# Patient Record
Sex: Female | Born: 1935 | Race: Black or African American
Health system: Southern US, Community
[De-identification: ages and names within clinical notes are randomized; demographics above are authoritative.]

---

## 2009-03-04 ENCOUNTER — Emergency Department: Payer: Self-pay | Admitting: Emergency Medicine

## 2010-01-23 ENCOUNTER — Inpatient Hospital Stay: Payer: Self-pay | Admitting: Internal Medicine

## 2010-02-16 ENCOUNTER — Inpatient Hospital Stay: Payer: Self-pay | Admitting: Internal Medicine

## 2010-03-28 ENCOUNTER — Ambulatory Visit: Payer: Self-pay | Admitting: Gastroenterology

## 2010-04-27 ENCOUNTER — Ambulatory Visit: Payer: Self-pay | Admitting: Ophthalmology

## 2010-04-27 DIAGNOSIS — I119 Hypertensive heart disease without heart failure: Secondary | ICD-10-CM

## 2010-05-09 ENCOUNTER — Ambulatory Visit: Payer: Self-pay | Admitting: Ophthalmology

## 2010-06-22 ENCOUNTER — Ambulatory Visit: Payer: Self-pay | Admitting: Ophthalmology

## 2010-06-27 ENCOUNTER — Ambulatory Visit: Payer: Self-pay | Admitting: Ophthalmology

## 2010-10-07 ENCOUNTER — Ambulatory Visit: Payer: Self-pay | Admitting: Family Medicine

## 2010-11-23 ENCOUNTER — Emergency Department: Payer: Self-pay | Admitting: Emergency Medicine

## 2011-01-05 ENCOUNTER — Ambulatory Visit: Payer: Self-pay | Admitting: Family Medicine

## 2011-01-23 ENCOUNTER — Ambulatory Visit: Payer: Self-pay | Admitting: Family Medicine

## 2011-05-26 ENCOUNTER — Inpatient Hospital Stay: Payer: Self-pay | Admitting: Student

## 2011-05-26 LAB — COMPREHENSIVE METABOLIC PANEL
Calcium, Total: 7.9 mg/dL — ABNORMAL LOW (ref 8.5–10.1)
Co2: 33 mmol/L — ABNORMAL HIGH (ref 21–32)
EGFR (Non-African Amer.): 25 — ABNORMAL LOW
Glucose: 106 mg/dL — ABNORMAL HIGH (ref 65–99)
Osmolality: 289 (ref 275–301)
Potassium: 5.6 mmol/L — ABNORMAL HIGH (ref 3.5–5.1)
Total Protein: 8.1 g/dL (ref 6.4–8.2)

## 2011-05-26 LAB — CBC
HGB: 10.9 g/dL — ABNORMAL LOW (ref 12.0–16.0)
MCH: 32.6 pg (ref 26.0–34.0)
MCV: 104 fL — ABNORMAL HIGH (ref 80–100)
Platelet: 207 10*3/uL (ref 150–440)
RBC: 3.34 10*6/uL — ABNORMAL LOW (ref 3.80–5.20)
RDW: 13.3 % (ref 11.5–14.5)

## 2011-05-26 LAB — URINALYSIS, COMPLETE
Bilirubin,UR: NEGATIVE
Glucose,UR: NEGATIVE mg/dL
Granular Cast: 19
Hyaline Cast: 9
Nitrite: NEGATIVE
Ph: 5
Protein: 30
RBC,UR: 215 /HPF
Specific Gravity: 1.011
Squamous Epithelial: 3
WBC UR: 4 /HPF

## 2011-05-26 LAB — FOLATE: Folic Acid: 28.8 ng/mL (ref 3.1–100.0)

## 2011-05-26 LAB — ACETAMINOPHEN LEVEL: Acetaminophen: <2 ug/mL

## 2011-05-26 LAB — PROTIME-INR
INR: 1.3
Prothrombin Time: 16.4 s — ABNORMAL HIGH

## 2011-05-26 LAB — TROPONIN I: Troponin-I: 0.22 ng/mL — ABNORMAL HIGH

## 2011-05-26 LAB — CK TOTAL AND CKMB (NOT AT ARMC)
CK, Total: 154 U/L
CK-MB: 1 ng/mL

## 2011-05-26 LAB — PRO B NATRIURETIC PEPTIDE: B-Type Natriuretic Peptide: 6429 pg/mL — ABNORMAL HIGH

## 2011-05-26 LAB — VALPROIC ACID LEVEL: Valproic Acid: 36 ug/mL — ABNORMAL LOW

## 2011-05-27 LAB — COMPREHENSIVE METABOLIC PANEL
Albumin: 2.7 g/dL — ABNORMAL LOW (ref 3.4–5.0)
Alkaline Phosphatase: 161 U/L — ABNORMAL HIGH (ref 50–136)
BUN: 46 mg/dL — ABNORMAL HIGH (ref 7–18)
Co2: 34 mmol/L — ABNORMAL HIGH (ref 21–32)
Creatinine: 1.95 mg/dL — ABNORMAL HIGH (ref 0.60–1.30)
EGFR (African American): 26 — ABNORMAL LOW
Potassium: 4.8 mmol/L (ref 3.5–5.1)
SGOT(AST): 799 U/L — ABNORMAL HIGH (ref 15–37)
SGPT (ALT): 367 U/L — ABNORMAL HIGH
Sodium: 143 mmol/L (ref 136–145)
Total Protein: 6.8 g/dL (ref 6.4–8.2)

## 2011-05-27 LAB — CBC WITH DIFFERENTIAL/PLATELET
Basophil #: 0 10*3/uL (ref 0.0–0.1)
Eosinophil #: 0 10*3/uL (ref 0.0–0.7)
HCT: 29.5 % — ABNORMAL LOW (ref 35.0–47.0)
Lymphocyte #: 0.8 10*3/uL — ABNORMAL LOW (ref 1.0–3.6)
Lymphocyte %: 16.5 %
MCV: 104 fL — ABNORMAL HIGH (ref 80–100)
Monocyte #: 0.5 x10 3/mm (ref 0.2–0.9)
Neutrophil #: 3.6 10*3/uL (ref 1.4–6.5)
Platelet: 172 10*3/uL (ref 150–440)
RBC: 2.84 10*6/uL — ABNORMAL LOW (ref 3.80–5.20)
RDW: 13.5 % (ref 11.5–14.5)
WBC: 5 10*3/uL (ref 3.6–11.0)

## 2011-05-27 LAB — CK TOTAL AND CKMB (NOT AT ARMC)
CK, Total: 138 U/L (ref 21–215)
CK, Total: 160 U/L (ref 21–215)
CK-MB: 1.4 ng/mL (ref 0.5–3.6)
CK-MB: 1.8 ng/mL (ref 0.5–3.6)

## 2011-05-27 LAB — HEMOGLOBIN A1C: Hemoglobin A1C: 5.4 % (ref 4.2–6.3)

## 2011-05-27 LAB — TROPONIN I: Troponin-I: 0.27 ng/mL — ABNORMAL HIGH

## 2011-05-28 LAB — HEPATIC FUNCTION PANEL A (ARMC)
Alkaline Phosphatase: 144 U/L — ABNORMAL HIGH (ref 50–136)
Bilirubin, Direct: <0.1 mg/dL (ref 0.00–0.20)
Bilirubin,Total: 0.2 mg/dL (ref 0.2–1.0)
SGPT (ALT): 287 U/L — ABNORMAL HIGH

## 2011-05-28 LAB — BASIC METABOLIC PANEL
BUN: 41 mg/dL — ABNORMAL HIGH (ref 7–18)
Calcium, Total: 7.2 mg/dL — ABNORMAL LOW (ref 8.5–10.1)
Chloride: 101 mmol/L (ref 98–107)
Co2: 34 mmol/L — ABNORMAL HIGH (ref 21–32)
EGFR (Non-African Amer.): 32 — ABNORMAL LOW
Glucose: 191 mg/dL — ABNORMAL HIGH (ref 65–99)
Osmolality: 298 (ref 275–301)

## 2011-05-29 LAB — CBC WITH DIFFERENTIAL/PLATELET
Basophil #: 0 10*3/uL (ref 0.0–0.1)
Basophil %: 0.1 %
HGB: 10.1 g/dL — ABNORMAL LOW (ref 12.0–16.0)
Lymphocyte #: 0.4 10*3/uL — ABNORMAL LOW (ref 1.0–3.6)
MCH: 32.3 pg (ref 26.0–34.0)
MCHC: 31.3 g/dL — ABNORMAL LOW (ref 32.0–36.0)
MCV: 103 fL — ABNORMAL HIGH (ref 80–100)
Monocyte #: 0.3 x10 3/mm (ref 0.2–0.9)
Monocyte %: 5.5 %
Neutrophil %: 86.7 %
Platelet: 206 10*3/uL (ref 150–440)
RDW: 13.4 % (ref 11.5–14.5)
WBC: 5.4 10*3/uL (ref 3.6–11.0)

## 2011-05-29 LAB — BASIC METABOLIC PANEL
Anion Gap: 6 — ABNORMAL LOW (ref 7–16)
Calcium, Total: 7.4 mg/dL — ABNORMAL LOW (ref 8.5–10.1)
Chloride: 100 mmol/L (ref 98–107)
Co2: 34 mmol/L — ABNORMAL HIGH (ref 21–32)
EGFR (Non-African Amer.): 37 — ABNORMAL LOW
Glucose: 199 mg/dL — ABNORMAL HIGH (ref 65–99)
Osmolality: 293 (ref 275–301)

## 2011-05-29 LAB — EXPECTORATED SPUTUM ASSESSMENT W GRAM STAIN, RFLX TO RESP C

## 2011-05-30 LAB — BASIC METABOLIC PANEL
Anion Gap: 5 — ABNORMAL LOW (ref 7–16)
BUN: 30 mg/dL — ABNORMAL HIGH (ref 7–18)
Chloride: 104 mmol/L (ref 98–107)
Co2: 34 mmol/L — ABNORMAL HIGH (ref 21–32)
Glucose: 168 mg/dL — ABNORMAL HIGH (ref 65–99)
Osmolality: 295 (ref 275–301)
Potassium: 4 mmol/L (ref 3.5–5.1)

## 2011-10-13 ENCOUNTER — Ambulatory Visit: Payer: Self-pay | Admitting: Geriatric Medicine

## 2011-10-13 LAB — URINALYSIS, COMPLETE
Bilirubin,UR: NEGATIVE
Glucose,UR: NEGATIVE mg/dL (ref 0–75)
Hyaline Cast: 4
Ketone: NEGATIVE
Nitrite: NEGATIVE
Protein: NEGATIVE
Specific Gravity: 1.016 (ref 1.003–1.030)
Squamous Epithelial: NONE SEEN
WBC UR: <1 /HPF (ref 0–5)

## 2011-10-15 ENCOUNTER — Ambulatory Visit: Payer: Self-pay | Admitting: Internal Medicine

## 2011-10-17 ENCOUNTER — Other Ambulatory Visit: Payer: Self-pay | Admitting: Geriatric Medicine

## 2011-10-17 LAB — URINALYSIS, COMPLETE
Bilirubin,UR: NEGATIVE
Blood: NEGATIVE
Glucose,UR: NEGATIVE mg/dL (ref 0–75)
Hyaline Cast: 18
Ketone: NEGATIVE
Leukocyte Esterase: NEGATIVE
Nitrite: NEGATIVE
Ph: 5 (ref 4.5–8.0)
RBC,UR: <1 /HPF (ref 0–5)
Squamous Epithelial: NONE SEEN
WBC UR: <1 /HPF (ref 0–5)

## 2011-10-18 ENCOUNTER — Inpatient Hospital Stay: Payer: Self-pay | Admitting: Internal Medicine

## 2011-10-18 ENCOUNTER — Ambulatory Visit: Payer: Self-pay | Admitting: Geriatric Medicine

## 2011-10-18 LAB — CBC WITH DIFFERENTIAL/PLATELET
Basophil #: 0 10*3/uL (ref 0.0–0.1)
Basophil %: 0.4 %
Comment - H1-Com1: NORMAL
HCT: 26 % — ABNORMAL LOW (ref 35.0–47.0)
HCT: 26.9 % — ABNORMAL LOW (ref 35.0–47.0)
HGB: 8 g/dL — ABNORMAL LOW (ref 12.0–16.0)
HGB: 8.5 g/dL — ABNORMAL LOW (ref 12.0–16.0)
Lymphocyte #: 0.8 10*3/uL — ABNORMAL LOW (ref 1.0–3.6)
Lymphocytes: 7 %
MCHC: 30.9 g/dL — ABNORMAL LOW (ref 32.0–36.0)
MCHC: 31.8 g/dL — ABNORMAL LOW (ref 32.0–36.0)
MCV: 98 fL (ref 80–100)
MCV: 98 fL (ref 80–100)
Monocytes: 11 %
NRBC/100 WBC: 2 /
Neutrophil #: 5.7 10*3/uL (ref 1.4–6.5)
Platelet: 235 10*3/uL (ref 150–440)
Platelet: 237 10*3/uL (ref 150–440)
RDW: 14.8 % — ABNORMAL HIGH (ref 11.5–14.5)
RDW: 14.7 % — ABNORMAL HIGH (ref 11.5–14.5)
WBC: 9.3 10*3/uL (ref 3.6–11.0)

## 2011-10-18 LAB — BASIC METABOLIC PANEL
Anion Gap: 5 — ABNORMAL LOW (ref 7–16)
Anion Gap: 4 — ABNORMAL LOW (ref 7–16)
BUN: 67 mg/dL — ABNORMAL HIGH (ref 7–18)
BUN: 70 mg/dL — ABNORMAL HIGH (ref 7–18)
Calcium, Total: 7.6 mg/dL — ABNORMAL LOW (ref 8.5–10.1)
Calcium, Total: 7.5 mg/dL — ABNORMAL LOW (ref 8.5–10.1)
Chloride: 108 mmol/L — ABNORMAL HIGH (ref 98–107)
Co2: 28 mmol/L (ref 21–32)
Creatinine: 2.91 mg/dL — ABNORMAL HIGH (ref 0.60–1.30)
EGFR (African American): 17 — ABNORMAL LOW
EGFR (African American): 16 — ABNORMAL LOW
EGFR (Non-African Amer.): 14 — ABNORMAL LOW
Glucose: 123 mg/dL — ABNORMAL HIGH (ref 65–99)
Osmolality: 293 (ref 275–301)
Osmolality: 300 (ref 275–301)
Potassium: 7 mmol/L (ref 3.5–5.1)
Potassium: 5.6 mmol/L — ABNORMAL HIGH (ref 3.5–5.1)
Sodium: 136 mmol/L (ref 136–145)

## 2011-10-18 LAB — COMPREHENSIVE METABOLIC PANEL
Alkaline Phosphatase: 200 U/L — ABNORMAL HIGH (ref 50–136)
Calcium, Total: 7.4 mg/dL — ABNORMAL LOW (ref 8.5–10.1)
Chloride: 105 mmol/L (ref 98–107)
Co2: 24 mmol/L (ref 21–32)
EGFR (African American): 18 — ABNORMAL LOW
EGFR (Non-African Amer.): 15 — ABNORMAL LOW
Osmolality: 295 (ref 275–301)
SGOT(AST): 65 U/L — ABNORMAL HIGH (ref 15–37)
SGPT (ALT): 13 U/L (ref 12–78)
Sodium: 137 mmol/L (ref 136–145)
Total Protein: 7 g/dL (ref 6.4–8.2)

## 2011-10-18 LAB — TROPONIN I: Troponin-I: <0.02 ng/mL

## 2011-10-19 DIAGNOSIS — I369 Nonrheumatic tricuspid valve disorder, unspecified: Secondary | ICD-10-CM

## 2011-10-19 DIAGNOSIS — I498 Other specified cardiac arrhythmias: Secondary | ICD-10-CM

## 2011-10-19 LAB — TROPONIN I
Troponin-I: 0.02 ng/mL
Troponin-I: 0.03 ng/mL

## 2011-10-19 LAB — BASIC METABOLIC PANEL
BUN: 68 mg/dL — ABNORMAL HIGH (ref 7–18)
Chloride: 109 mmol/L — ABNORMAL HIGH (ref 98–107)
Co2: 24 mmol/L (ref 21–32)
Creatinine: 2.77 mg/dL — ABNORMAL HIGH (ref 0.60–1.30)
EGFR (African American): 17 — ABNORMAL LOW
Osmolality: 302 (ref 275–301)
Sodium: 141 mmol/L (ref 136–145)

## 2011-10-19 LAB — CBC WITH DIFFERENTIAL/PLATELET
Basophil %: 0.3 %
Eosinophil #: 0 10*3/uL (ref 0.0–0.7)
Eosinophil %: 0.2 %
HCT: 24 % — ABNORMAL LOW (ref 35.0–47.0)
HGB: 7.4 g/dL — ABNORMAL LOW (ref 12.0–16.0)
Lymphocyte #: 0.7 10*3/uL — ABNORMAL LOW (ref 1.0–3.6)
MCH: 30 pg (ref 26.0–34.0)
MCV: 98 fL (ref 80–100)
Monocyte #: 0.5 x10 3/mm (ref 0.2–0.9)
Monocyte %: 6 %
Neutrophil #: 7.5 10*3/uL — ABNORMAL HIGH (ref 1.4–6.5)
Platelet: 197 10*3/uL (ref 150–440)
RBC: 2.46 10*6/uL — ABNORMAL LOW (ref 3.80–5.20)
WBC: 8.7 10*3/uL (ref 3.6–11.0)

## 2011-10-19 LAB — IRON AND TIBC
Iron Bind.Cap.(Total): 421 ug/dL (ref 250–450)
Iron Saturation: 5 %
Iron: 19 ug/dL — ABNORMAL LOW (ref 50–170)

## 2011-10-19 LAB — CK TOTAL AND CKMB (NOT AT ARMC)
CK, Total: 132 U/L (ref 21–215)
CK, Total: 134 U/L (ref 21–215)
CK-MB: 2.3 ng/mL (ref 0.5–3.6)
CK-MB: 2.8 ng/mL (ref 0.5–3.6)

## 2011-10-19 LAB — URINE CULTURE

## 2011-10-19 LAB — MAGNESIUM: Magnesium: 3.2 mg/dL — ABNORMAL HIGH

## 2011-10-20 LAB — URINALYSIS, COMPLETE
Bilirubin,UR: NEGATIVE
Glucose,UR: NEGATIVE mg/dL (ref 0–75)
Ketone: NEGATIVE
Leukocyte Esterase: NEGATIVE
Nitrite: NEGATIVE
Specific Gravity: 1.011 (ref 1.003–1.030)
Squamous Epithelial: <1
WBC UR: 4 /HPF (ref 0–5)

## 2011-10-20 LAB — CBC WITH DIFFERENTIAL/PLATELET
Comment - H1-Com3: NORMAL
HCT: 24.4 % — ABNORMAL LOW (ref 35.0–47.0)
MCH: 29.2 pg (ref 26.0–34.0)
MCV: 97 fL (ref 80–100)
Monocytes: 5 %
NRBC/100 WBC: 4 /
Platelet: 172 10*3/uL (ref 150–440)
RBC: 2.51 10*6/uL — ABNORMAL LOW (ref 3.80–5.20)
RDW: 14.8 % — ABNORMAL HIGH (ref 11.5–14.5)
Segmented Neutrophils: 88 %

## 2011-10-20 LAB — BASIC METABOLIC PANEL
Anion Gap: 7 (ref 7–16)
BUN: 69 mg/dL — ABNORMAL HIGH (ref 7–18)
Calcium, Total: 6.9 mg/dL — CL (ref 8.5–10.1)
EGFR (African American): 20 — ABNORMAL LOW
EGFR (Non-African Amer.): 17 — ABNORMAL LOW
Glucose: 190 mg/dL — ABNORMAL HIGH (ref 65–99)
Osmolality: 303 (ref 275–301)

## 2011-10-20 LAB — PROTEIN / CREATININE RATIO, URINE
Creatinine, Urine: 83.9 mg/dL (ref 30.0–125.0)
Protein, Random Urine: 82 mg/dL — ABNORMAL HIGH (ref 0–12)
Protein/Creat. Ratio: 977 mg/gCREAT — ABNORMAL HIGH (ref 0–200)

## 2011-10-21 LAB — BASIC METABOLIC PANEL
Anion Gap: 4 — ABNORMAL LOW (ref 7–16)
Calcium, Total: 6.9 mg/dL — CL (ref 8.5–10.1)
Chloride: 110 mmol/L — ABNORMAL HIGH (ref 98–107)
Co2: 26 mmol/L (ref 21–32)
Creatinine: 2.01 mg/dL — ABNORMAL HIGH (ref 0.60–1.30)
EGFR (African American): 25 — ABNORMAL LOW
EGFR (Non-African Amer.): 22 — ABNORMAL LOW
Glucose: 126 mg/dL — ABNORMAL HIGH (ref 65–99)
Osmolality: 297 (ref 275–301)

## 2011-10-21 LAB — HEMOGLOBIN: HGB: 7.2 g/dL — ABNORMAL LOW (ref 12.0–16.0)

## 2011-10-22 LAB — COMPREHENSIVE METABOLIC PANEL
Albumin: 2.3 g/dL — ABNORMAL LOW (ref 3.4–5.0)
Anion Gap: 7 (ref 7–16)
BUN: 45 mg/dL — ABNORMAL HIGH (ref 7–18)
Calcium, Total: 7.2 mg/dL — ABNORMAL LOW (ref 8.5–10.1)
Chloride: 112 mmol/L — ABNORMAL HIGH (ref 98–107)
Co2: 27 mmol/L (ref 21–32)
EGFR (African American): 36 — ABNORMAL LOW
EGFR (Non-African Amer.): 31 — ABNORMAL LOW
Glucose: 96 mg/dL (ref 65–99)
Osmolality: 302 (ref 275–301)
Potassium: 4.6 mmol/L (ref 3.5–5.1)
SGOT(AST): 197 U/L — ABNORMAL HIGH (ref 15–37)
SGPT (ALT): 261 U/L — ABNORMAL HIGH (ref 12–78)
Sodium: 146 mmol/L — ABNORMAL HIGH (ref 136–145)
Total Protein: 5.8 g/dL — ABNORMAL LOW (ref 6.4–8.2)

## 2011-10-22 LAB — CBC WITH DIFFERENTIAL/PLATELET
Lymphocyte %: 18.6 %
Monocyte #: 0.8 x10 3/mm (ref 0.2–0.9)
Monocyte %: 13.6 %
Platelet: 151 10*3/uL (ref 150–440)
RBC: 2.2 10*6/uL — ABNORMAL LOW (ref 3.80–5.20)
RDW: 14.9 % — ABNORMAL HIGH (ref 11.5–14.5)
WBC: 5.9 10*3/uL (ref 3.6–11.0)

## 2011-10-22 LAB — PROTEIN ELECTROPHORESIS(ARMC)

## 2011-10-22 LAB — OCCULT BLOOD X 1 CARD TO LAB, STOOL: Occult Blood, Feces: POSITIVE

## 2011-10-23 LAB — CBC WITH DIFFERENTIAL/PLATELET
Eosinophil #: 0.1 10*3/uL (ref 0.0–0.7)
Eosinophil %: 0.6 %
Lymphocyte %: 14.8 %
MCH: 30.1 pg (ref 26.0–34.0)
MCHC: 31.8 g/dL — ABNORMAL LOW (ref 32.0–36.0)
MCV: 95 fL (ref 80–100)
Monocyte #: 1.2 x10 3/mm — ABNORMAL HIGH (ref 0.2–0.9)
Platelet: 153 10*3/uL (ref 150–440)
RBC: 2.49 10*6/uL — ABNORMAL LOW (ref 3.80–5.20)
RDW: 14.9 % — ABNORMAL HIGH (ref 11.5–14.5)
WBC: 8.9 10*3/uL (ref 3.6–11.0)

## 2011-10-23 LAB — BASIC METABOLIC PANEL
Anion Gap: 5 — ABNORMAL LOW (ref 7–16)
BUN: 36 mg/dL — ABNORMAL HIGH (ref 7–18)
Calcium, Total: 7.8 mg/dL — ABNORMAL LOW (ref 8.5–10.1)
Chloride: 113 mmol/L — ABNORMAL HIGH (ref 98–107)
Co2: 27 mmol/L (ref 21–32)
Creatinine: 1.58 mg/dL — ABNORMAL HIGH (ref 0.60–1.30)
Osmolality: 299 (ref 275–301)
Potassium: 4.6 mmol/L (ref 3.5–5.1)

## 2011-10-23 LAB — VANCOMYCIN, TROUGH: Vancomycin, Trough: 14 ug/mL (ref 10–20)

## 2011-10-23 LAB — UR PROT ELECTROPHORESIS, URINE RANDOM

## 2011-10-24 LAB — CULTURE, BLOOD (SINGLE)

## 2011-10-29 ENCOUNTER — Inpatient Hospital Stay: Payer: Self-pay | Admitting: Internal Medicine

## 2011-10-29 LAB — URINALYSIS, COMPLETE
Bilirubin,UR: NEGATIVE
Glucose,UR: NEGATIVE mg/dL (ref 0–75)
Hyaline Cast: 53
Leukocyte Esterase: NEGATIVE
Nitrite: NEGATIVE
Protein: 30
RBC,UR: 1 /HPF (ref 0–5)
Specific Gravity: 1.017 (ref 1.003–1.030)
Squamous Epithelial: 2
WBC UR: 2 /HPF (ref 0–5)

## 2011-10-29 LAB — CK TOTAL AND CKMB (NOT AT ARMC)
CK, Total: 98 U/L (ref 21–215)
CK-MB: 0.8 ng/mL (ref 0.5–3.6)

## 2011-10-29 LAB — COMPREHENSIVE METABOLIC PANEL
Albumin: 2.7 g/dL — ABNORMAL LOW (ref 3.4–5.0)
Alkaline Phosphatase: 144 U/L — ABNORMAL HIGH (ref 50–136)
Anion Gap: 5 — ABNORMAL LOW (ref 7–16)
BUN: 30 mg/dL — ABNORMAL HIGH (ref 7–18)
Bilirubin,Total: 0.5 mg/dL (ref 0.2–1.0)
Calcium, Total: 7.6 mg/dL — ABNORMAL LOW (ref 8.5–10.1)
Chloride: 108 mmol/L — ABNORMAL HIGH (ref 98–107)
Creatinine: 1.63 mg/dL — ABNORMAL HIGH (ref 0.60–1.30)
EGFR (African American): 32 — ABNORMAL LOW
Glucose: 124 mg/dL — ABNORMAL HIGH (ref 65–99)
Potassium: 6.5 mmol/L (ref 3.5–5.1)
SGPT (ALT): 113 U/L — ABNORMAL HIGH (ref 12–78)
Sodium: 140 mmol/L (ref 136–145)
Total Protein: 7.2 g/dL (ref 6.4–8.2)

## 2011-10-29 LAB — CBC
HGB: 8.3 g/dL — ABNORMAL LOW (ref 12.0–16.0)
MCH: 29.8 pg (ref 26.0–34.0)
MCV: 97 fL (ref 80–100)
Platelet: 169 10*3/uL (ref 150–440)
RBC: 2.78 10*6/uL — ABNORMAL LOW (ref 3.80–5.20)

## 2011-10-29 LAB — TROPONIN I: Troponin-I: 0.07 ng/mL — ABNORMAL HIGH

## 2011-10-29 LAB — MAGNESIUM: Magnesium: 3 mg/dL — ABNORMAL HIGH

## 2011-10-29 LAB — POTASSIUM: Potassium: 6.1 mmol/L — ABNORMAL HIGH (ref 3.5–5.1)

## 2011-10-29 LAB — PHOSPHORUS: Phosphorus: 3.4 mg/dL (ref 2.5–4.9)

## 2011-10-29 LAB — PRO B NATRIURETIC PEPTIDE: B-Type Natriuretic Peptide: 3888 pg/mL — ABNORMAL HIGH (ref 0–450)

## 2011-10-30 LAB — COMPREHENSIVE METABOLIC PANEL
Albumin: 2.5 g/dL — ABNORMAL LOW (ref 3.4–5.0)
Alkaline Phosphatase: 135 U/L (ref 50–136)
Anion Gap: 5 — ABNORMAL LOW (ref 7–16)
Bilirubin,Total: 0.3 mg/dL (ref 0.2–1.0)
Calcium, Total: 7.7 mg/dL — ABNORMAL LOW (ref 8.5–10.1)
Co2: 30 mmol/L (ref 21–32)
Creatinine: 1.58 mg/dL — ABNORMAL HIGH (ref 0.60–1.30)
EGFR (African American): 34 — ABNORMAL LOW
Osmolality: 291 (ref 275–301)
Potassium: 5.3 mmol/L — ABNORMAL HIGH (ref 3.5–5.1)
SGOT(AST): 88 U/L — ABNORMAL HIGH (ref 15–37)
SGPT (ALT): 93 U/L — ABNORMAL HIGH (ref 12–78)

## 2011-10-30 LAB — CBC WITH DIFFERENTIAL/PLATELET
Basophil %: 0.8 %
Eosinophil #: 0.1 10*3/uL (ref 0.0–0.7)
HCT: 24.3 % — ABNORMAL LOW (ref 35.0–47.0)
Lymphocyte #: 1 10*3/uL (ref 1.0–3.6)
Lymphocyte %: 15.4 %
MCHC: 31.2 g/dL — ABNORMAL LOW (ref 32.0–36.0)
MCV: 97 fL (ref 80–100)
Neutrophil #: 4.4 10*3/uL (ref 1.4–6.5)
Platelet: 143 10*3/uL — ABNORMAL LOW (ref 150–440)
RBC: 2.5 10*6/uL — ABNORMAL LOW (ref 3.80–5.20)
RDW: 15.2 % — ABNORMAL HIGH (ref 11.5–14.5)

## 2011-10-30 LAB — BASIC METABOLIC PANEL
Anion Gap: 5 — ABNORMAL LOW (ref 7–16)
BUN: 29 mg/dL — ABNORMAL HIGH (ref 7–18)
Chloride: 108 mmol/L — ABNORMAL HIGH (ref 98–107)
EGFR (Non-African Amer.): 32 — ABNORMAL LOW
Potassium: 5.6 mmol/L — ABNORMAL HIGH (ref 3.5–5.1)

## 2011-10-30 LAB — TROPONIN I: Troponin-I: 0.14 ng/mL — ABNORMAL HIGH

## 2011-10-31 LAB — BASIC METABOLIC PANEL
BUN: 27 mg/dL — ABNORMAL HIGH (ref 7–18)
Calcium, Total: 7.6 mg/dL — ABNORMAL LOW (ref 8.5–10.1)
Chloride: 106 mmol/L (ref 98–107)
Co2: 32 mmol/L (ref 21–32)
EGFR (African American): 42 — ABNORMAL LOW
Osmolality: 293 (ref 275–301)
Potassium: 4.6 mmol/L (ref 3.5–5.1)

## 2011-10-31 LAB — CBC WITH DIFFERENTIAL/PLATELET
Basophil %: 0.8 %
Eosinophil #: 0.1 10*3/uL (ref 0.0–0.7)
Eosinophil %: 2.3 %
HCT: 23.4 % — ABNORMAL LOW (ref 35.0–47.0)
HGB: 7.2 g/dL — ABNORMAL LOW (ref 12.0–16.0)
Lymphocyte %: 17.2 %
MCHC: 30.6 g/dL — ABNORMAL LOW (ref 32.0–36.0)
Monocyte #: 0.8 x10 3/mm (ref 0.2–0.9)
Monocyte %: 11.9 %
Neutrophil #: 4.3 10*3/uL (ref 1.4–6.5)
Neutrophil %: 67.8 %
RBC: 2.46 10*6/uL — ABNORMAL LOW (ref 3.80–5.20)
WBC: 6.4 10*3/uL (ref 3.6–11.0)

## 2011-11-01 LAB — BASIC METABOLIC PANEL
Anion Gap: 6 — ABNORMAL LOW (ref 7–16)
BUN: 24 mg/dL — ABNORMAL HIGH (ref 7–18)
Calcium, Total: 8 mg/dL — ABNORMAL LOW (ref 8.5–10.1)
Chloride: 101 mmol/L (ref 98–107)
Co2: 34 mmol/L — ABNORMAL HIGH (ref 21–32)
EGFR (African American): 46 — ABNORMAL LOW
Osmolality: 292 (ref 275–301)
Potassium: 4.5 mmol/L (ref 3.5–5.1)

## 2011-11-01 LAB — CBC WITH DIFFERENTIAL/PLATELET
Basophil #: 0 10*3/uL (ref 0.0–0.1)
Eosinophil #: 0 10*3/uL (ref 0.0–0.7)
Eosinophil %: 0 %
HCT: 26.8 % — ABNORMAL LOW (ref 35.0–47.0)
MCH: 29.9 pg (ref 26.0–34.0)
MCHC: 32.2 g/dL (ref 32.0–36.0)
Monocyte #: 0.3 x10 3/mm (ref 0.2–0.9)
Neutrophil %: 82.6 %
Platelet: 194 10*3/uL (ref 150–440)
RBC: 2.89 10*6/uL — ABNORMAL LOW (ref 3.80–5.20)
RDW: 14.9 % — ABNORMAL HIGH (ref 11.5–14.5)
WBC: 4.5 10*3/uL (ref 3.6–11.0)

## 2011-11-03 LAB — BASIC METABOLIC PANEL
Anion Gap: 7 (ref 7–16)
BUN: 26 mg/dL — ABNORMAL HIGH (ref 7–18)
Chloride: 100 mmol/L (ref 98–107)
Creatinine: 1.14 mg/dL (ref 0.60–1.30)
EGFR (African American): 50 — ABNORMAL LOW
EGFR (Non-African Amer.): 43 — ABNORMAL LOW
Glucose: 152 mg/dL — ABNORMAL HIGH (ref 65–99)
Osmolality: 293 (ref 275–301)
Potassium: 4.2 mmol/L (ref 3.5–5.1)

## 2011-11-04 LAB — CULTURE, BLOOD (SINGLE)

## 2011-11-14 ENCOUNTER — Ambulatory Visit: Payer: Self-pay | Admitting: Internal Medicine

## 2011-12-10 IMAGING — CR DG CHEST 2V
1 series · 3 of 3 positions shown · non-contrast
Comparison: none

REASON FOR EXAM: chest pain
COMMENTS:   May transport without cardiac monitor

[Series 1: view not recorded · 0.17mm/px · 3 of 3 slices shown]
[im 1/3]
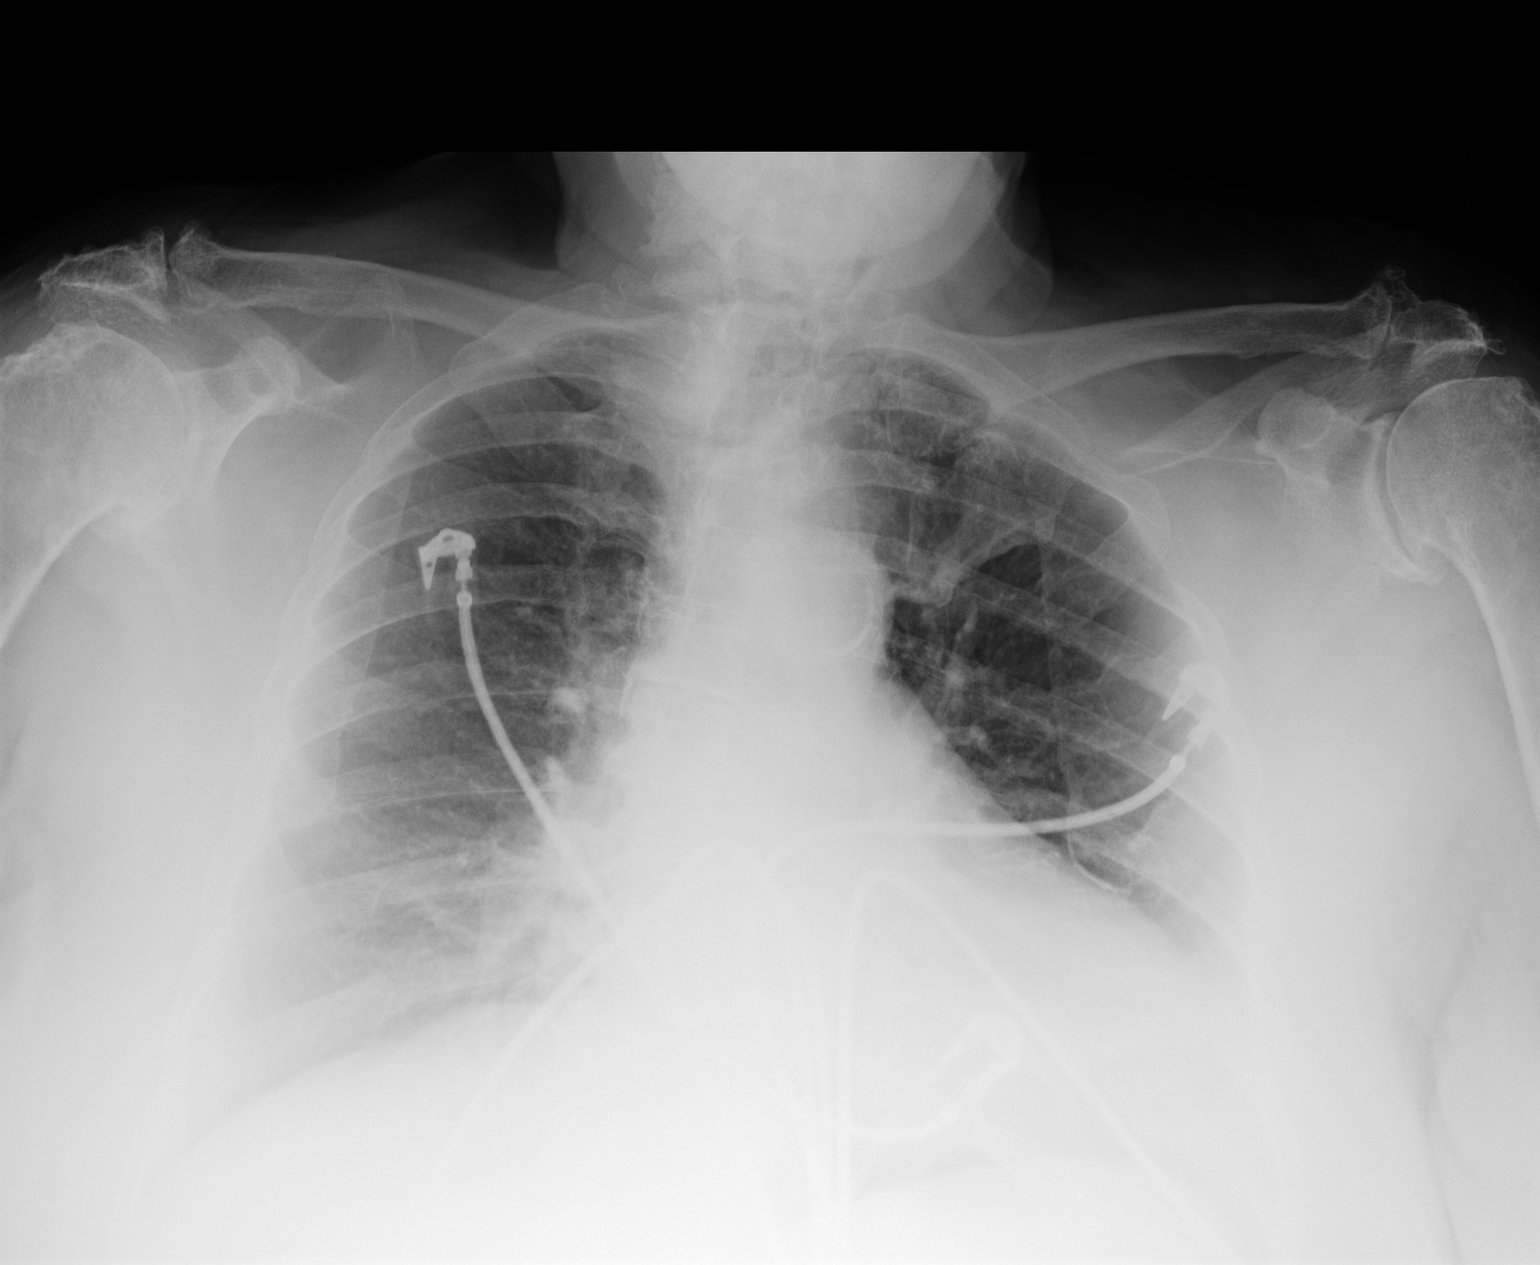
[im 2/3]
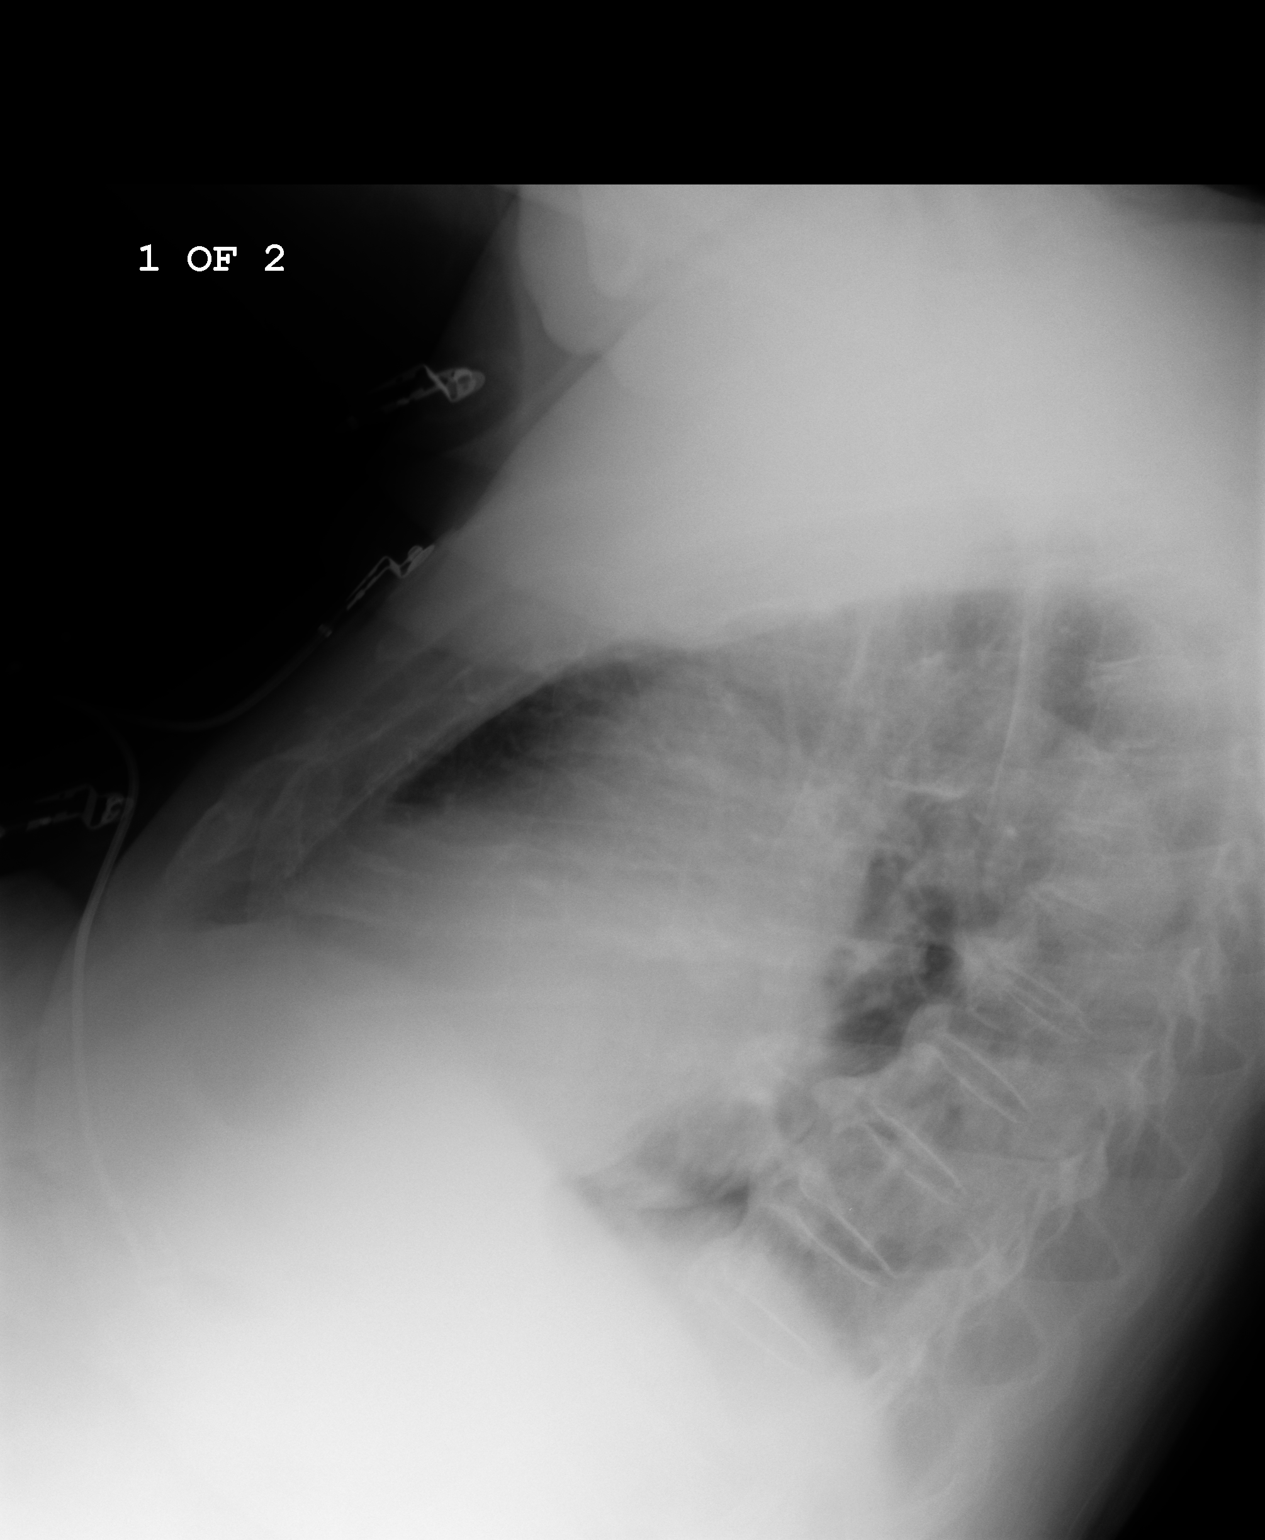
[im 3/3]
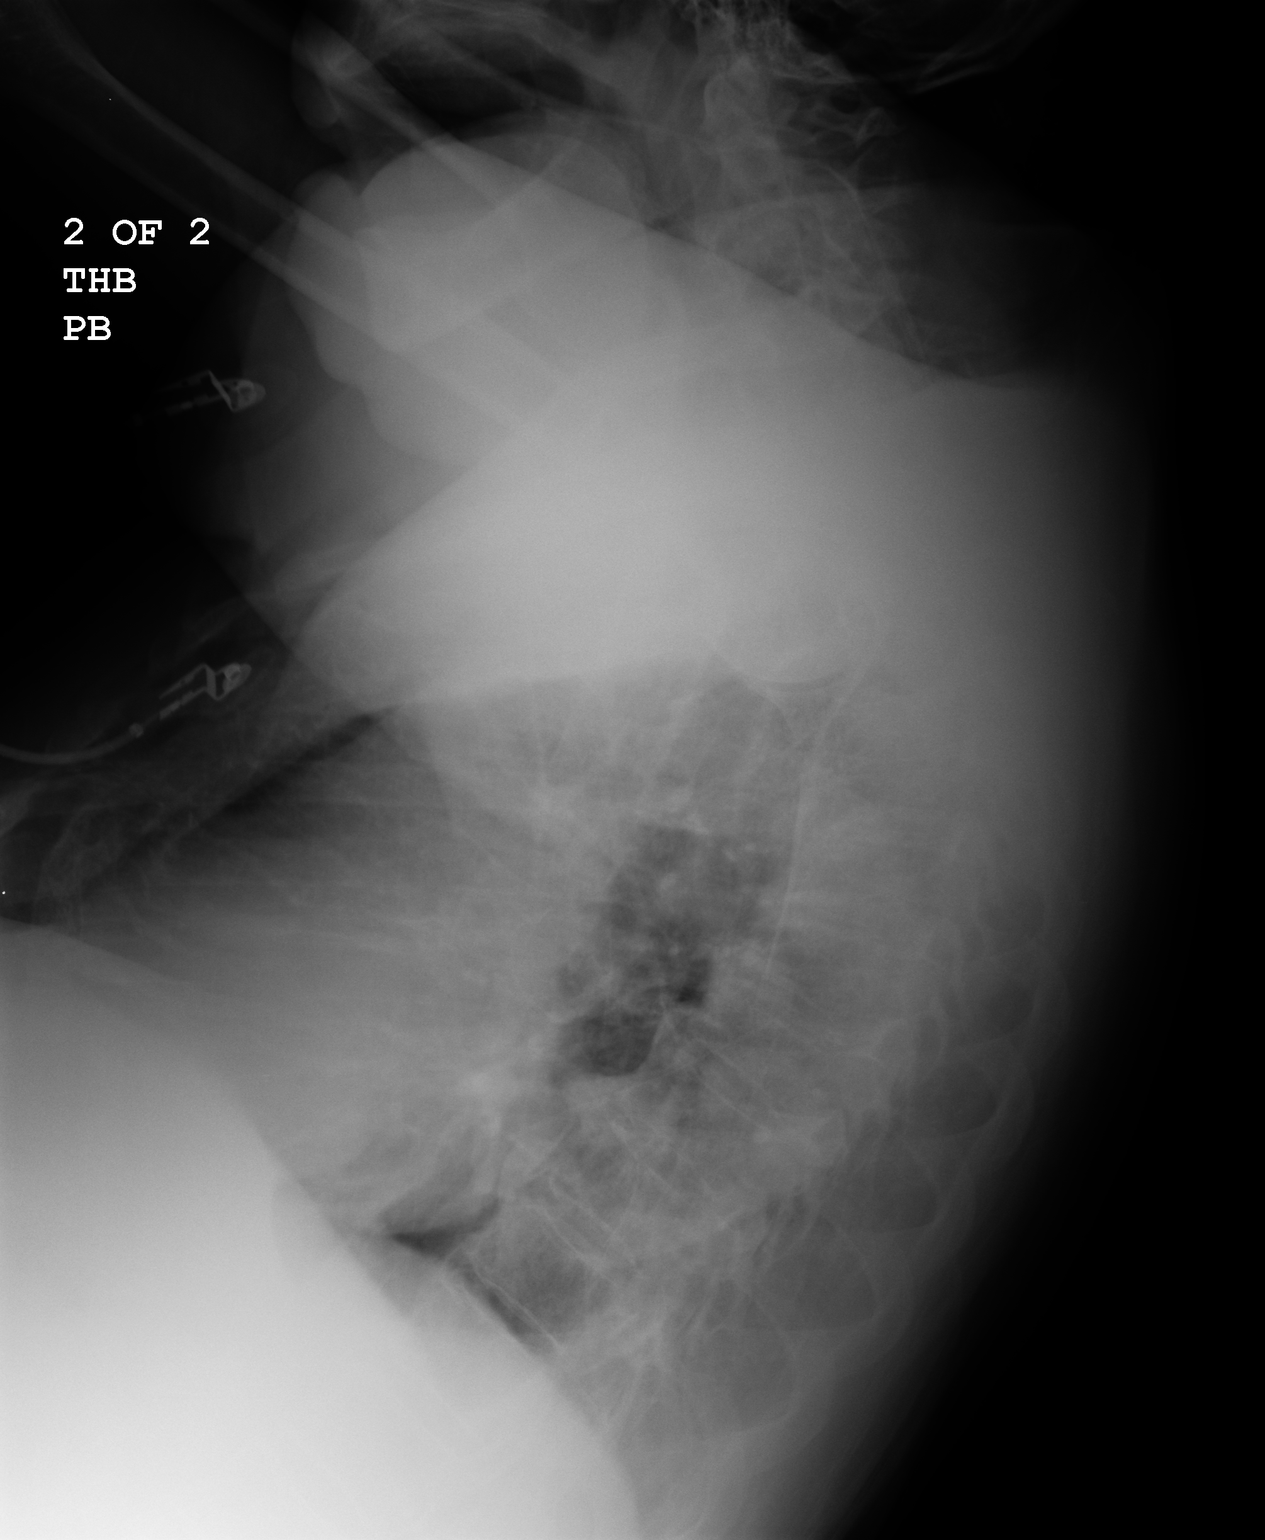

[3 of 3 positions shown; findings below may reference images not displayed]

PROCEDURE:     DXR - DXR CHEST PA (OR AP) AND LATERAL  - March 04, 2009 [DATE]

RESULT:     There is no previous exam for comparison. The projection is
lordotic. Monitoring electrodes are present. Degenerative spurring is
present within the spine. There does not appear to be a significant pleural
effusion. There is no edema or definite pneumonia.
IMPRESSION: Follow-up erect PA and lateral views of the chest would be
recommended. There is no definite evidence of congestive failure or
infiltrate. The heart size appears to be somewhat prominent but given the
lordotic projection this may be artifactual. Atherosclerotic changes are
noted in the aortic arch.

## 2012-11-24 IMAGING — NM NUCLEAR MEDICINE GASTROINTESTINAL BLEEDING STUDY
1 series · 6 of 6 positions shown · non-contrast
Comparison: none

REASON FOR EXAM: rectal bleeding
COMMENTS:

PROCEDURE:     NM  - NM GI BLOOD LOSS STUDY  - February 17, 2010  [DATE]
RESULT:     Comparison: None.
Radiopharmaceutical: 22.32 mCi 5c-VVm administered intravenously. 3 mL PYP
TECHNIQUE: Dynamic, anterior planar images of the abdomen were obtained over
60 minutes.

[Series 1000: gi bleed · 4.80mm/px · 6 of 250 frames shown]
[frame 21/250]
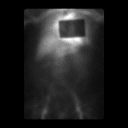
[frame 63/250]
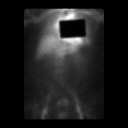
[frame 105/250]
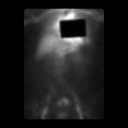
[frame 146/250]
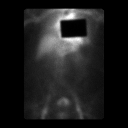
[frame 188/250]
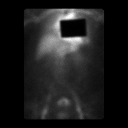
[frame 230/250]
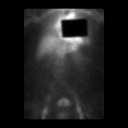

[6 of 6 positions shown; findings below may reference images not displayed]

FINDINGS: There is normal radiotracer activity in the liver and vascular system. There
is mild, progressive activity in the lower pelvis which is felt to represent
excreted radiotracer activity in the bladder. This does not peristalse. No
abnormal radiotracer activity identified in the remainder of the abdomen or
pelvis.
IMPRESSION: No scintigraphic evidence of gastrointestinal bleed. Radiotracer activity in
the pelvis is felt to be within the bladder.

## 2013-01-01 ENCOUNTER — Other Ambulatory Visit: Payer: Self-pay | Admitting: Family Medicine

## 2013-01-01 LAB — CBC WITH DIFFERENTIAL/PLATELET
Basophil %: 0.3 %
HCT: 26 % — ABNORMAL LOW (ref 35.0–47.0)
HGB: 7.5 g/dL — ABNORMAL LOW (ref 12.0–16.0)
Lymphocyte #: 1 10*3/uL (ref 1.0–3.6)
Lymphocyte %: 5.3 %
MCV: 80 fL (ref 80–100)
Monocyte #: 1.5 x10 3/mm — ABNORMAL HIGH (ref 0.2–0.9)
Monocyte %: 7.8 %
Neutrophil #: 16.6 10*3/uL — ABNORMAL HIGH (ref 1.4–6.5)
Neutrophil %: 86.6 %
Platelet: 163 10*3/uL (ref 150–440)
WBC: 19.2 10*3/uL — ABNORMAL HIGH (ref 3.6–11.0)

## 2013-01-01 LAB — URINALYSIS, COMPLETE
Bilirubin,UR: NEGATIVE
Protein: 100
Specific Gravity: 1.017 (ref 1.003–1.030)

## 2013-01-01 LAB — BASIC METABOLIC PANEL
Anion Gap: 4 — ABNORMAL LOW (ref 7–16)
Co2: 33 mmol/L — ABNORMAL HIGH (ref 21–32)
Creatinine: 2.93 mg/dL — ABNORMAL HIGH (ref 0.60–1.30)
EGFR (African American): 16 — ABNORMAL LOW
EGFR (Non-African Amer.): 14 — ABNORMAL LOW
Sodium: 134 mmol/L — ABNORMAL LOW (ref 136–145)

## 2013-01-03 LAB — URINE CULTURE

## 2013-01-17 ENCOUNTER — Other Ambulatory Visit: Payer: Self-pay | Admitting: Family Medicine

## 2013-01-17 LAB — BASIC METABOLIC PANEL
Anion Gap: 5 — ABNORMAL LOW (ref 7–16)
Calcium, Total: 7.7 mg/dL — ABNORMAL LOW (ref 8.5–10.1)
Chloride: 99 mmol/L (ref 98–107)
Co2: 36 mmol/L — ABNORMAL HIGH (ref 21–32)
EGFR (African American): 30 — ABNORMAL LOW
EGFR (Non-African Amer.): 26 — ABNORMAL LOW
Potassium: 4.1 mmol/L (ref 3.5–5.1)
Sodium: 140 mmol/L (ref 136–145)

## 2013-01-17 LAB — CBC WITH DIFFERENTIAL/PLATELET
Basophil %: 0.7 %
Eosinophil #: 0.1 10*3/uL (ref 0.0–0.7)
Eosinophil %: 1.8 %
HCT: 26.5 % — ABNORMAL LOW (ref 35.0–47.0)
Lymphocyte %: 24 %
MCH: 23.6 pg — ABNORMAL LOW (ref 26.0–34.0)
MCHC: 29.2 g/dL — ABNORMAL LOW (ref 32.0–36.0)
Monocyte #: 0.9 x10 3/mm (ref 0.2–0.9)
Monocyte %: 14.4 %
Neutrophil %: 59.1 %
Platelet: 203 10*3/uL (ref 150–440)
RBC: 3.28 10*6/uL — ABNORMAL LOW (ref 3.80–5.20)
RDW: 21.2 % — ABNORMAL HIGH (ref 11.5–14.5)

## 2013-02-13 DEATH — deceased

## 2014-06-02 NOTE — Consult Note (Signed)
PATIENT NAME:  Jacqueline Browning, Jacqueline Browning MR#:  [id number] DATE OF BIRTH:  [birth year]  DATE OF CONSULTATION:  10/19/2011  REFERRING PHYSICIAN:  Dr. Darvin Neighbours  CONSULTING PHYSICIAN:  Kordelia Severin Lilian Kapur, MD  REASON FOR CONSULTATION: Acute renal failure, hyperkalemia.  HISTORY OF PRESENT ILLNESS: The patient is an 81 year old African American female with past medical history of diastolic congestive heart failure, hypertension, diabetes mellitus, dementia, coronary artery disease, gastroesophageal reflux disease, diverticulosis, chronic anemia, history of dysphagia who presented to [facility] with shortness of breath and abnormal blood work. She resides at [facility]. She previously lived at [facility] but the patient's granddaughter states that she was moved to [facility] subsequently. Patient unfortunately is a poor historian and unable to provide any significant history regarding her present illness. The patient's granddaughter is currently at the bedside. It appears that she has had quite poor p.o. intake over the past day or two per the patient's granddaughter's report. She is significantly hypotensive now and was started on dopamine. She is also receiving IV fluids at a rate of 250 mL/h. She had a 2-D echocardiogram performed today which showed a normal ejection fraction, however, there was a pattern of pseudonormalization which suggests underlying diastolic heart failure. She is coughing significantly. She has secretions which are thick and brown in color. Patient had a chest x-ray performed. The patient has been receiving Lasix. Her urine is extremely concentrated at this time. She has also receive some rectal Kayexalate. Potassium is currently down to 5.9. The patient's baseline creatinine appears to be 1.14 from 11/2010. She also has a low hemoglobin of 7.4. Patient had a urinalysis performed on 09/03 which showed urine protein 30 mg/dL, only 1 RBC was noted per high-power field.    PAST MEDICAL HISTORY:  1. Diastolic heart failure.  2. Hypertension.  3. Diabetes mellitus.  4. Dementia.  5. Coronary artery disease.  6. Gastroesophageal reflux disease.  7. Diverticulosis.  8. Chronic anemia.  9. History of dysphagia.   ALLERGIES: Celebrex and Zantac.   CURRENT INPATIENT MEDICATIONS:  1. Tylenol 650 mg p.o. every four hours p.r.n.  2. Advair Diskus 250/50, 1 puff inhaled b.i.d.  3. Lasix 40 mg IV every 12 hours. 4. Heparin 5000 units sub-Q q.12 hours.   5. Sliding scale insulin. 6. Singulair 10 mg p.o. daily.  7. Zofran 4 mg IV every four hours p.r.n.  8. Protonix 40 mg p.o. q.6 a.m.   SOCIAL HISTORY: Patient resides at [facility]. She quit smoking approximately one year ago. No reported alcohol or illicit drug use. Patient has a health care power of attorney who is [employer].   FAMILY HISTORY: Currently unobtainable from the patient.    REVIEW OF SYSTEMS: Unobtainable from the patient as she is unable to provide reliable review of systems.   PHYSICAL EXAMINATION:  VITAL SIGNS: Temperature 95.7, pulse 46, respirations 19, blood pressure 127/16, pulse oximetry 96% on 3 liters.   GENERAL: Morbidly obese African American female who appears her reported age, currently in no acute distress.   HEENT: Normocephalic, atraumatic. Extraocular movements are intact. Pupils equal, round, reactive to light. No scleral icterus. Conjunctivae are pale. No epistaxis noted. Gross hearing is difficult to assess at this time. Oral mucosa are quite dry.   NECK: Supple without JVD or lymphadenopathy.   LUNGS: Lungs demonstrate coarse rhonchi bilaterally with normal respiratory effort.   CARDIOVASCULAR: S1, S2. Patient is noted to be bradycardic. No murmurs or rubs.  ABDOMEN: Obese, soft, nontender, nondistended. Bowel sounds positive. No rebound or guarding. No gross organomegaly appreciated.   EXTREMITIES: No clubbing, cyanosis noted. 2+ pitting edema noted in  both lower extremities.   NEUROLOGICAL: Patient is awake. She will follow a few simple commands but not consistently.   GENITOURINARY: Foley catheter noted to be in place.   MUSCULOSKELETAL: No joint redness, swelling or tenderness appreciated.   SKIN: Warm and dry. No rashes noted.   PSYCHIATRIC: Unable to assess at this time.   LABORATORY, DIAGNOSTIC AND RADIOLOGICAL DATA: Sodium 141, potassium 5.9, chloride 109, CO2 24, BUN 68, creatinine 2.7, glucose 109, iron 19, iron saturation 5. Troponin 0.02. CBC shows WBC 8.7, hemoglobin 7.4, hematocrit 24, platelets 197. Blood cultures thus far are negative. Urine culture from 09/03 is thus far negative. Urinalysis performed on 10/17/2011 shows urine protein 30 mg/dL, less than 1 RBC per high-power field as well as less than 1 WBC per high-power field.   IMPRESSION AND RECOMMENDATIONS: This is an 81 year old African American female with past medical history of diastolic heart failure, known sinus bradycardia, hypertension, diabetes mellitus, dementia, coronary artery disease, gastroesophageal reflux disease, diverticulosis, chronic anemia with iron deficiency now, mild dysphagia who presented to [facility] with shortness of breath, acute renal failure.  1. Acute renal failure. I suspect most likely that the patient has underlying acute tubular necrosis. She is hypotensive now which may be contributing to acute tubular necrosis. We will discontinue Lasix at this point in time and giving the patient 250 mL of normal saline as a bolus. Thereafter we will decrease the rate of normal saline to 80 mL/h. We will proceed with further work-up including renal ultrasound, SPEP, UPEP, ANA, ANCA antibodies, GBM antibodies, C3, and C4. No acute indication for dialysis at this time, however, if potassium continues to be an issue we may need to consider renal placement therapy. The patient's health care power of attorney, Brunetta Jeans, appears to be  willing to perform this if needed. However, we did explain that it is not indicated at this moment; however, we will continue to follow the patient's case quite closely.  2. Hyperkalemia. Serum potassium was quite high at presentation at 6.6. It peaked at 7. It is now 5.9. We will continue to provide rectal Kayexalate for now. However, as above, if this continues to be an issue we may need to consider renal replacement therapy.  3. Hypotension. Unclear what is causing her hypotension at present. Patient's ejection fraction was found to be normal on 2-D echocardiogram. Patient has a significant cough with productive sputum which could potentially represent pneumonia and could be a potential source for sepsis. I will discuss this a bit further with the hospitalist and would consider antibiotic therapy.  4. Iron deficiency anemia. Patient noted to be quite anemic with a hemoglobin of 7.4 and low iron saturation of 5%. However, given concerns for infection would not administer parenteral iron at this time. Would consider blood transfusion for hemoglobin of 7 or less or if acute coronary syndrome were to be present.   I would like to thank Dr. Darvin Neighbours for this kind referral.  ____________________________ Tama High, MD mnl:cms D: 10/19/2011 12:53:38 ET T: 10/19/2011 13:09:16 ET JOB#: [id number]  cc: Tama High, MD, <Dictator> Mariah Milling Ngan Qualls MD ELECTRONICALLY SIGNED 11/21/2011 23:24

## 2014-06-02 NOTE — Consult Note (Signed)
CC: anemia, heme positive stool.  Hgb came from 6.6 to 7.5 after one unit.  No obvious bleeding.  No GI exams planned.  Continue bid PPI.  Electronic Signatures: Scot JunElliott, Robert T (MD)  (Signed on 09-Sep-13 18:50)  Authored  Last Updated: 09-Sep-13 18:50 by Scot JunElliott, Robert T (MD)

## 2014-06-02 NOTE — Consult Note (Signed)
PATIENT NAME:  Jacqueline Browning, Jacqueline Browning MR#:  [id number] DATE OF BIRTH:  Sep 21, 1921  DATE OF CONSULTATION:  10/19/2011  REFERRING PHYSICIAN:  Milagros Loll, MD CONSULTING PHYSICIAN:  Muhammad A. Kirke Corin, MD PRIMARY CARE PHYSICIAN: Dorothey Baseman, MD  REASON FOR CONSULTATION: Bradycardia.   HISTORY OF PRESENT ILLNESS: This is an 81 year old African American female who is a resident of a skilled nursing facility. She was brought to the emergency room after she was noted to have abnormal labs with renal failure. She has history of diastolic heart failure, hypertension, coronary artery disease with unknown details and history of asthma. She had an echocardiogram done in April which showed normal LV systolic function, moderate mitral regurgitation, and moderate pulmonary hypertension. The patient is not able to provide any history at this time. I am not sure if this is her baseline. The patient was noted to have acute renal failure with a creatinine of 2.1. Most recent renal function was within normal limits a few months ago. Her potassium was noted to be 7. She was bradycardic in the 30s. She received atropine, calcium, insulin, dextrose, and bicarbonate. Her heart rate is currently in the mid 40s. Blood pressure is stable.   PAST MEDICAL HISTORY:  1. Diastolic heart failure.  2. Known history of sinus bradycardia. Her baseline heart rate runs in the 50s.  3. Hypertension.  4. Type 2 diabetes.  5. Dementia.  6. Reported chronic coronary artery disease.  7. GERD.  8. Diverticulosis.  9. Chronic anemia.  10. Dysphagia.   ALLERGIES: Celebrex and Zantac.   HOME MEDICATIONS:  1. Acetaminophen/oxycodone.  2. Advair Diskus.  3. Albuterol.  4. Atrovent.  5. Alprazolam.  6. Amlodipine 10 mg daily.  7. Aricept 10 mg daily.  8. Aspirin 81 mg once daily.  9. Fentanyl patch 25 mcg every 3 days.  10. Neurontin 300 mg 2 times daily.  11. Eyedrops.  12. Glipizide.  13. Lisinopril 5 mg daily.  14. Singulair  10 mg daily.  15. Omeprazole 20 mg daily.  16. MiraLAX.  17. Sertraline 100 mg daily.  18. Vitamin D3.   FAMILY HISTORY: Unknown and unable to obtain from the patient.   SOCIAL HISTORY: The patient is a resident of a skilled nursing facility. She is a previous smoker.   REVIEW OF SYSTEMS: Unable to obtain at this time from the patient.   PHYSICAL EXAMINATION:   GENERAL: The patient is an elderly female who is currently in no acute distress.   VITAL SIGNS: Pulse is 44 beats per minute, respiratory rate is 21, blood pressure is 131/70, oxygen saturation is 98% on 3L nasal cannula.   HEENT: Normocephalic, atraumatic.   NECK: No jugular venous distention or carotid bruits.   RESPIRATORY: Normal respiratory effort with diminished breath sounds at the base.   CARDIOVASCULAR: Normal PMI. Normal S1 and S2 but bradycardic. No gallops or murmurs.   ABDOMEN: Benign, nontender, nondistended.   EXTREMITIES: +1 edema bilaterally.   SKIN: Warm and dry with no rash.   PSYCHIATRIC: Patient is not communicating at this time.   LABORATORY AND DIAGNOSTIC DATA: Initial creatinine was 2.65 which worsened slightly but then improved to 2.77. BUN is 68. Potassium on presentation was 7 and improved to 5.9. She is anemic with a hemoglobin of 7.4. Thyroid function was within normal limits. Cardiac enzymes were normal. ECG showed junctional bradycardia. Currently she is having intermittent sinus bradycardia with junctional bradycardia.   IMPRESSION:  1. Bradycardia, likely due to electrolyte abnormalities in the  setting of acute renal failure.  2. Chronic diastolic heart failure.  3. Acute renal failure.  4. Dementia.   RECOMMENDATIONS: The patient's bradycardia appears to be improving with treatment. Her baseline heart rate tends to run in the 50s anyway. I agree with current management including correcting the underlying electrolyte abnormalities, especially hyperkalemia. Her potassium is still a  little bit high at 5.9. Continue hydration to improve renal function. There is no indication for a permanent pacemaker at this time. Can use atropine as needed and dopamine as well if blood pressure becomes low. However, this does not appear to be needed at this time. If her hyperkalemia worsens, calcium gluconate can be given.   ____________________________ Chelsea AusMuhammad A. Kirke CorinArida, MD maa:vtd D: 10/19/2011 09:12:12 ET T: 10/19/2011 10:51:59 ET JOB#: [id number] cc: Muhammad A. Kirke CorinArida, MD, <Dictator> Srikar R. Sudini, MD Teena Iraniavid M. Terance HartBronstein, MD Jerolyn CenterMUHAMMAD Argentina DonovanA ARIDA MD ELECTRONICALLY SIGNED 11/17/2011 13:15

## 2014-06-02 NOTE — Discharge Summary (Signed)
PATIENT NAME:  Jacqueline Browning, Jacqueline Browning MR#:  [id number] DATE OF BIRTH:  1921/05/02  DATE OF ADMISSION:  10/18/2011 DATE OF DISCHARGE:  10/24/2011   DISCHARGE DIAGNOSES:  1. Severe hyperkalemia, now resolved.  2. Acute renal failure likely due to acute tubular necrosis with hypotension on admission, now improving.  3. Acute on chronic diastolic heart failure with normal echocardiogram with normal LV function well compensated at this time. 4. Altered mental status, likely metabolic encephalopathy from underlying renal failure and hyperkalemia along with acute illnesses, now close to baseline. She also has likely underlying dementia. She had negative blood culture. 5. Possible pneumonia, on ora Augmentin, improving.  6. Hypocalcemia, replaced and resolved. 7. Bradycardia/hypotension on admission, required dopamine, in ICU for a few days. Now she is off dopamine and blood pressure and heart rate are stable.  8. Anemia of chronic disease with iron deficiency requiring 1 unit of packed red blood cell transfusion. No gross bleeding. Hemoglobin is stable.  9. Cough likely due to pneumonia, on Robitussin and antibiotics.   SECONDARY DIAGNOSES:  1. History of congestive heart failure.  2. Bradycardia.  3. Hypertension.  4. Diabetes.  5. Dementia.  6. Coronary artery disease. 7. CKD. 8. Gastroesophageal reflux disease. 9. Diverticulosis with rectal bleeding. 10. Chronic anemia.  11. Mild dysphagia.   CONSULTATIONS:  1. Cardiology, Dr. Kirke Corin   2. Nephrology, Dr. Cherylann Ratel   3. Speech therapy  4. GI, Dr. Mechele Collin   5. Palliative Care   PROCEDURES/RADIOLOGY:  1. Chest x-ray on September 4th showed findings consistent with CHF and pulmonary interstitial edema.  2. Chest x-ray on September 5th showed CHF. Possible pleural effusion and likely underlying atelectasis.  3. Bilateral renal ultrasound on September 5th showed mild medical renal disease within the right kidney, otherwise unremarkable.  4. 2-D  echocardiogram on September 5th showed EF of more than 55%. Pseudonormalization pattern. Mild concentric LVH. Mildly dilated left atrium. Mild to moderate tricuspid regurgitation. Elevated RV systolic pressure at 40 to 50 mmHg.   MAJOR LABORATORY PANEL: Blood cultures x2 were negative on admission. Serum cortisol level was high with a value of 30.7. Urine eosinophil was negative. Urine protein electrophoresis was within normal limits except for elevated total protein with a value of 68.3. Serum ANA was negative. ANCA panel was negative. Antiglomerular base membrane antibody was negative. Serum complement C3 was within normal limits with a value of 110. Serum complement C4 was also within normal limits. Serum protein electrophoresis was within normal murmurs. Urinalysis on September 6th showed 4 WBCs, trace bacteria, otherwise negative. Hemoccult blood in the stool was positive on September 8th.   HISTORY AND SHORT HOSPITAL COURSE: The patient is an 81 year old female with the above-mentioned medical problems who was admitted with severe hyperkalemia along with acute renal failure. Please see Dr. Eddie North dictated history and physical for further details. Her potassium was as high as up to 7. She was also found to be bradycardic with heart rate in the 30's and required some atropine. She was admitted to CCU. She was also found to be hypotensive and was started on dopamine but her blood pressure continued to stay low and required several more days of inpatient CCU stay. She was also evaluated by Nephrology, Dr. Cherylann Ratel, considering her acute renal failure and her kidney function was slowly improving. This was thought to be due to ATN. Her creatinine was 2.91 at the highest and was slowly improving. The last creatinine was from yesterday which was 1.58 and she was producing  good urine output.   The patient was evaluated by Palliative Care and she remained FULL CODE after discussion with family as they wanted  everything to be done. The patient continued to have some trouble breathing for which x-ray was repeated and was found to have possible pneumonia. Will start her on IV antibiotics. Speech therapy consultation was obtained and they recommended her to be on pureed diet with thin liquids along with strict aspiration precautions. While in the CCU, she was also found to have anemia with hemoglobin down to 6.6 for which she was transfused 1 unit of packed red blood cells. GI consultation was obtained with Dr. Mechele Collin as her stool for Hemoccult was positive. She was not found to have any gross GI bleed so Dr. Mechele Collin recommended continued monitoring and twice a day proton pump inhibitor which she was started on. She was doing much better on September 10th and is being discharged back to [facility] in stable condition.   On the date of discharge her vital signs are as follows: Temperature 97.8, heart rate 80 per minute, respirations 20 per minute, blood pressure 133/64 mmHg. She is saturating 92% on room air.   PERTINENT PHYSICAL EXAMINATION ON THE DATE OF DISCHARGE: CARDIOVASCULAR: S1, S2 normal. No murmurs, rubs, or gallop. LUNGS: Clear to auscultation bilaterally. No wheezing, rales, rhonchi, or crepitation. ABDOMEN: Soft, benign. NEUROLOGIC: Nonfocal examination. All other physical examination remained at baseline.   DISCHARGE MEDICATIONS:  1. Advair 250/50 one puff b.i.d.  2. Albuterol/ipratropium inhaled every six hours.  3. Omeprazole 20 mg p.o. b.i.d.  4. Lidoderm 5% topical film apply to affected area once a day.  5. Lisinopril 5 mg p.o. daily.  6. Fentanyl patch transdermal once daily every 72 hours. 7. Aspirin 81 mg p.o. daily.  8. Sertraline 100 mg p.o. daily and 25 mg p.o. daily.  9. Glipizide 2.5 mg p.o. daily.  10. Aricept 10 mg p.o. at bedtime. 11. Montelukast 10 mg p.o. daily.  12. Vitamin D3 50,000 international units once a month.  13. Acetaminophen/oxycodone 325/5 mg 1 tablet  every six hours as needed.  14. DuoNeb 1 puff inhaled every six hours as needed.  15. Combivent puffs inhaled every six hours as needed.  16. Gabapentin 300 mg p.o. b.i.d.  17. Cheratussin AC 5 mL p.o. b.i.d.  18. Alprazolam 0.25 mg p.o. every eight hours as needed.  19. Atropine 1% ophthalmic solution two drops sublingually every four hours as needed.  20. MiraLAX once daily.  21. GenTeal gel forming solution one drop to each eye 3 times a day. 22. Robitussin 10 mL every six hours as needed. 23. Amlodipine 5 mg p.o. daily. 24. Augmentin 875/125 mg 1 tablet p.o. b.i.d. for two more days.   DISCHARGE DIET: Renal diet.   DISCHARGE ACTIVITY: As tolerated.   DISCHARGE INSTRUCTIONS AND FOLLOW-UP:  1. The patient was instructed to have pureed diet with thin liquids to be monitored with straw use; medications in puree, strict aspiration precautions to include sitting fully upright with head forward. Small sips/bites. Eat/drink slowly. Encourage less talking to avoid exertion/shortness of breath during meals and drinking.   2. She will need follow-up with her primary care physician, Dr. Dorothey Baseman, in 1 to 2 weeks, with Dr. Mosetta Pigeon from Nephrology in 2 to 4 weeks, with GI, Dr. Mechele Collin, in 4 to 6 weeks. She will also need follow-up with Dr. Kirke Corin from [facility] in 3 to 4 weeks.  3. She will get physical therapy  evaluation and management while at the facility.  4. She was requested to get CBC and basic metabolic panel check in one week with results forwarded to primary care physician and Dr. Doristine ChurchSingh's office for evaluation of her kidney functions.   TOTAL TIME DISCHARGING THIS PATIENT: 55 minutes.   ____________________________ Ellamae SiaVipul S. Sherryll BurgerShah, MD vss:drc D: 10/24/2011 12:30:02 ET T: 10/24/2011 13:35:08 ET JOB#: [id number]  cc: Ashraf Mesta S. Sherryll BurgerShah, MD, <Dictator> Teena Iraniavid M. Terance HartBronstein, MD Chelsea AusMuhammad A. Kirke CorinArida, MD Scot Junobert T. Elliott, MD Munsoor Lizabeth LeydenN. Lateef, MD Ellamae SiaVIPUL S [facility] ServicesHAH MD ELECTRONICALLY  SIGNED 10/24/2011 14:03

## 2014-06-02 NOTE — Consult Note (Signed)
    Comments   Had a phone conversation with pt's granddaughter, Jacqueline Browning. Updated her on pt's medical status. Prior to this event, she says pt was living at the nursing home in her usual state of health. At baseline, pt is wheelchair bound and staff use a mechanical lift to transfer. Pt is able to independently wheel self through the facility using motorized w/c. She is able to feed self. Pt's basline mental status is actually quite good and confusion is only a transient problem. Pt does have a son but he too is in an SNF. Granddaughter is pt's HCPOA.  talked about code status at length. Granddaughter works in [employer], [employer] and is familiar with risks vs benefits of resuscitation. She says that the last time that she had a conversation with patient the pt wanted "everything done." However granddaughter admits that patient may have had a limited understanding of the conversation. Granddaughter wants pt to remain a full code for now but plans to speak to other members of the family to make a long-term decision.  25 minutes  Electronic Signatures: Borders, Daryl EasternJoshua R (NP)  (Signed 05-Sep-13 15:24)  Authored: Palliative Care   Last Updated: 05-Sep-13 15:24 by Malachy MoanBorders, Joshua R (NP)

## 2014-06-02 NOTE — Consult Note (Signed)
CC: heme positive stool.  Pt has been hydrated appropriately since admission and this may contribute to her gradual fall in hgb.  the heme positive is not surprising given a rectal tube which can cause mucsoal irritation.  She had sigmoid diverticulosis on colonoscopy attempt in 02/2010,  No obvious active bleeding at this time.  I will increase her Protonix to bid in case there is any UGI mucosal inflammation.  I do not think she warrants endoscopic exam unless there were active life threatening bleeding.  See dictated note.  Consult limited to GI tract.  Electronic Signatures: Scot JunElliott, Robert T (MD)  (Signed on 08-Sep-13 12:08)  Authored  Last Updated: 08-Sep-13 12:08 by Scot JunElliott, Robert T (MD)

## 2014-06-02 NOTE — Discharge Summary (Signed)
PATIENT NAME:  Jacqueline Browning, Jacqueline Browning MR#:  [id number] DATE OF BIRTH:  25-Jan-1922  DATE OF ADMISSION:  10/29/2011 DATE OF DISCHARGE:  11/03/2011  ADMITTING DIAGNOSIS: Shortness of breath, hyperkalemia.   DISCHARGE DIAGNOSES:  1. Recurrent hyperkalemia felt to be due to hyporeninemic hypoaldosteronism probably playing some role. Seen by nephrology. They recommended no ACE inhibitors.  2. Acute on chronic renal failure. Creatinine improved close to baseline.  3. Acute on chronic respiratory failure thought to be multifactorial including acute on chronic diastolic congestive heart failure, possible bronchospasm with chronic obstructive pulmonary disease exacerbation as well as possible acute bronchitis.  4. Acute on chronic diastolic congestive heart failure. The patient will be continued on Lasix. No ACE inhibitor due to her recurrent hyperkalemia per nephrology evaluation.  5. Elevated troponin likely in the setting of congestive heart failure and hypoxemia. No evidence of acute myocardial infarction. Likely demand ischemia. No chest pain.  6. Acute hypercarbic respiratory failure, suspect she likely has underlying sleep apnea. The patient was briefly placed on BiPAP.  7. Bradycardia during previous hospitalization. Her heart rates have been stable during this hospitalization.  8. Dementia.  9. Diabetes.  10. Depression.  11. Anemia. Likely anemia of chronic disease, status post transfusion.  12. Overall poor prognosis, seen by palliative care and is made DO NOT RESUSCITATE.  13. Gastroesophageal reflux disease.  14. Chronic pain syndrome.   PERTINENT LABORATORY DATA AND EVALUATIONS: Admitting serum glucose 124, BUN 30, creatinine 1.6, sodium 140, potassium 6.5, chloride 108, bicarbonate 27. Magnesium elevated at 3. LFTs: Mildly elevated alkaline phosphatase, AST 133, ALT 113. Troponin was 0.07, WBC count was 8, hemoglobin 8.3, platelet count 169. Most recent creatinine today is 1.14, potassium staying  at 4.2. Her hemoglobin is stable at 8.3. ABG on 09/16 showed pH of 7.28, pCO2 77, pO2 65. Troponin was 0.18 and 0.14. Urinalysis was nitrite negative, leukocytes negative. BNP was elevated at 3888. Chest x-ray on the fifteenth showed findings that raised the possibility of interstitial pulmonary edema. Right basal opacity may be due to atelectasis.   CONSULTANTS:  1. Dr. Mady Haagensen  2. Dr. Harriett Sine Phifer   HOSPITAL COURSE:  1. Please refer to the History and Physical done by the admitting physician. The patient is an 81 year old female who was recently hospitalized secondary to shortness of breath. She had acute on chronic diastolic congestive heart failure as well as chronic obstructive pulmonary disease exacerbation and also had hyperkalemia at that time. The patient was discharged back to the skilled nursing facility. She came back with progressive shortness of breath. The patient again was noted to be hyperkalemic. She received Kayexalate for her hyperkalemia. Her ACE inhibitor was stopped. She was seen in consultation by nephrology who felt that the patient likely had component of hyporeninemic hypoaldosteronism playing some role. Avoid ACE and ARB. Since then her potassium has remained normal.  2. Acute on chronic respiratory failure: The patient continued to have shortness of breath. Chest x-ray suggested congestive heart failure. She was given IV Lasix. She also had wheezing. She was treated for acute chronic obstructive pulmonary disease exacerbation. She also had a productive cough. She was treated for acute bronchitis with antibiotics. The patient's respiratory status has improved. She also became lethargic and sleepy during the hospitalization. ABG check showed she was retaining pCO2. She had to be placed on a brief BiPAP course. The patient at this time is close to her baseline. Her prognosis is very poor. She was seen by palliative care who made  her DO NOT RESUSCITATE. At this time she is  stable to return back to the skilled nursing facility.   DISCHARGE INSTRUCTIONS:  The patient is to be weighed every day at the same time, preferably first thing in the morning after urinating and before eating. Call physician if gain of more than 2 pounds in one day or 5 pounds in one week. For increased weight gain, tiredness, swelling in legs or feet, shortness of breath, urinating less, or feeling faint, call 911. LVEF was done prior to the admission.   CODE STATUS: DO NOT RESUSCITATE.   DISCHARGE MEDICATIONS:  1. Advair 250/50 1 puff twice a day.  2. Vitamin D3 50,000 international units daily.  3. Aspirin 81 mg 1 tab p.o. daily. 4. Glipizide 2.5 daily.  5. Sertraline 125 p.o. daily.  6. Singulair 10 daily.  7. Aricept 10 at bedtime.  8. Cheratussin AC 10 mL  b.i.d. p.r.n. for cough. 9. Gabapentin 300, 1 tab p.o. b.i.d.  10. GenTeal ophthalmic gel-forming solution, one drop to each eye 3 times per day.  11. Atropine 1% ophthalmic solution, two drops sublingually? every four hours as needed for secretions.  12. Alprazolam 0.2, 1 tab p.o. q. 8  p.r.n. for anxiety.  13. MiraLAX 17 grams daily in 8 ounces of water.  14. Combivent 1 puff q. 6 p.r.n. shortness of breath.  15. Acetaminophen/oxycodone 325/5 mg q. 6 p.r.n. pain.  16. Robitussin 10 mL q. 6 p.r.n.  17. Novolin sliding scale insulin, please refer to discharge medication list.  18. Lidoderm 5% topically, apply to right knee once a day. Remove as directed.  19. Albuterol Atrovent nebulizer 4 times a day and as needed. 20. Fentanyl 25 mcg, change every 72 hours. 21. Amlodipine 5 daily.  22. Omeprazole 20 daily.  23. Prednisone taper 60 mg, taper by 10 mg until complete.  24. Tylenol 650 mg q. 4 p.r.n. for pain or temperature.  25. Levofloxacin 750 mg 1 tab orally every 48 hours for four days.  26. Lasix 20 mg, 1 tab p.o. b.i.d.  The patient is told to stop the lisinopril.   TREATMENT: Diabetic treatment per M.D.   DIET:  Low sodium, low fat, low cholesterol. Puree diet with thin liquids, medications in puree, gravy added to foods to moisten. Strict aspiration precautions.   ACTIVITY: As tolerated. Physical therapy evaluation and treatment.   FOLLOWUP: Follow up with Dr. Terance HartBronstein in 1 to 2 weeks. Check a BMP in five days. Follow up on the potassium.     TIME SPENT:  35 minutes.   ____________________________ Lacie ScottsShreyang H. Allena KatzPatel, MD shp:bjt D: 11/03/2011 12:23:02 ET T: 11/03/2011 12:54:35 ET JOB#: [id number]  cc: Carlen Fils H. Allena KatzPatel, MD, <Dictator> Charise CarwinSHREYANG H Makale Pindell MD ELECTRONICALLY SIGNED 11/07/2011 15:00

## 2014-06-02 NOTE — Consult Note (Signed)
Brief Consult Note: Diagnosis: bradycardia likely due to electrolyte abnormalites in setting of acute renal failure.   Comments: Baseline HR is in 50s.  Correct underlying abnormalites.  No indication for a pacemaker.  Can use prn Atropine/Dopamine but that does not seem to be needed at this time.  Electronic Signatures: Lorine BearsArida, Muhammad (MD)  (Signed 05-Sep-13 09:04)  Authored: Brief Consult Note   Last Updated: 05-Sep-13 09:04 by Lorine BearsArida, Muhammad (MD)

## 2014-06-02 NOTE — Consult Note (Signed)
PATIENT NAME:  Jacqueline Browning, LUEVANOS MR#:  [id number] DATE OF BIRTH:  [birth year]  DATE OF CONSULTATION:  10/22/2011  REFERRING PHYSICIAN:    CONSULTING PHYSICIAN:  Scot Jun, MD  REASON FOR CONSULTATION: Anemia with heme positive stool with history of diverticulosis.   HISTORY OF PRESENT ILLNESS: The patient is an 81 year old black female who has morbid obesity. She was brought to the hospital from [facility] and had severe bradycardia, hyperkalemia, renal insufficiency, and anemia. She was admitted to the hospital. She was rehydrated. Her hyperkalemia was treated with fluids, calcium, and Kayexalate. She had a hemoglobin of 8 upon admission. The hemoglobin has slowly drifted down to 6.6 and a unit of blood was ordered to be transfused. I was asked to see her because of history of diverticulosis heme positive stool, and the falling blood count.   The patient had a colonoscopy done with Dr. Barnetta Chapel on 02/21/2010 that showed multiple small mouth diverticula of the sigmoid colon. The colon was unusually difficult due to inability to maintain adequate sedation and also to tortuosity of the colon and it would not advance any further than that at that time.   Since admission the patient has been on Protonix prophylaxis for stress ulceration at 40 mg a day. On admission her hemoglobin was only 8 and she has been hydrated since then and the hemoglobin has slowly drifted down to 6.6. There had been no obvious bleeding. She has had a rectal tube put in because of the Kayexalate for the hyperkalemia and she was having a lot of loose bowel movements. There was no obvious blood in the bowel movements and there has been no hematemesis either.   The patient has multiple complex medical problems well outlined in other notes. I will deal only with the GI questions mentioned. The patient has no history of peptic ulcer disease that I can find in the old records and no prior upper endoscopy in current  probation system records.   PHYSICAL EXAMINATION:   GENERAL: Very obese black female, thick accent but able to communicate well. Answers questions that are asked her.   VITAL SIGNS: Weight 283, temperature 98.3, pulse 72, respirations 14, blood pressure 111/45, pulse oximetry 93%.   HEENT: Sclerae nonicteric. Conjunctivae somewhat pale. Tongue looks pinker than would be expected with a hemoglobin of 6.6.   ABDOMEN: Massive obesity. No tenderness. No hepatosplenomegaly.   RECTAL: She has a rectal tube placed with some brownish brown green stool in it.   She has been on heparin 5000 sub-Q b.i.d. This has been stopped.   LABORATORY, DIAGNOSTIC, AND RADIOLOGICAL DATA: BUN 45, creatinine 1.49, sodium 146, albumin 2.3, SGOT 197, SGPT 261, hemoglobin 6.6, white count 5.9, platelet count 151. Yesterday the hemoglobin was 7.2, on the 6th it was 7.3, on the 5th it was 7.4, and on admission it was 8.   ASSESSMENT:  1. No evidence of active GI bleeding. Given the fact that she has a rectal tube in, that sometimes can cause irritation and produce heme positive stool.  2. Considering she was very dehydrated when she came in and had been rehydrated the net intake since she has been here is 6 liters. That certainly would be expected to cause some dilution of her hemoglobin and affect the value found on CBC. Since there is no active bleeding going on, no intervention needs to be done at this time. However, because of the heme positive stool and the gradual fall in hemoglobin, I think  it is reasonable to add in a second dose of oral Protonix daily in case there is stress ulcers or other mucosal inflammation of the upper GI tract.  3. I agree with transfusion of 1 unit of blood and then monitoring her hemoglobin. 4. Would reserve endoscopy for only active bleeding.  5. Will follow with you.   ____________________________ Scot Junobert T. Terron Merfeld, MD rte:drc D: 10/22/2011 12:05:04 ET T: 10/22/2011 12:33:07  ET JOB#: [id number]  cc: Scot Junobert T. Heloise Gordan, MD, <Dictator> Scot JunOBERT T Seletha Zimmermann MD ELECTRONICALLY SIGNED 10/24/2011 13:59

## 2014-06-02 NOTE — H&P (Signed)
PATIENT NAME:  Jacqueline Browning, Jacqueline Browning MR#:  [id number] DATE OF BIRTH:  [birth year]  DATE OF ADMISSION:  10/29/2011  PRIMARY CARE PHYSICIAN: Dr. Terance Hart   CHIEF COMPLAINT: Shortness of breath and hyperkalemia.   HISTORY OF PRESENT ILLNESS: This is an 81 year old female who was just recently discharged from the hospital and presents to the hospital secondary to some shortness of breath. The patient herself is a very poor historian given her dementia and her slurred speech which is chronic for her. Most of the history was obtained from the ER physician and also from the chart. The patient was here in the hospital for treatment for acute hyperkalemia, renal failure, and aspiration pneumonia and discharged about a week or so ago. She returns back with some shortness of breath. The skilled nursing facility was concerned that her pneumonia had recurred and therefore sent her to the Emergency Room. In the Emergency Room, the patient was not noted to be hypoxic but noted to have a potassium of 6.5 and a slightly elevated troponin. She was also noted to have worsening lower extremity edema and also upper extremity edema. Hospitalist services were contacted for further treatment and evaluation.   REVIEW OF SYSTEMS: Currently unobtainable given the patient's mental status.   PAST MEDICAL HISTORY:  1. Congestive heart failure secondary to diastolic dysfunction.  2. Hypertension.  3. Hyperlipidemia.  4. Chronic pain syndrome.  5. Depression, anxiety.  6. Chronic obstructive pulmonary disease. 7. Gastroesophageal reflux disease.    ALLERGIES: Celebrex and Zantac.   SOCIAL HISTORY: No smoking. No alcohol abuse. No illicit drug abuse. Currently resides at a skilled nursing facility [facility].   FAMILY HISTORY: Currently unobtainable given the patient's mental status, but as per previous History and Physical it was also unable to be obtained.   CURRENT MEDICATIONS:  1. Tylenol with oxycodone 5/325, 1 tab q.  6 hours as needed.  2. Advair 250/50 1 puff b.i.d.  3. DuoNebs albuterol ipratropium q. 6 hours as needed.  4. Xanax 0.25 mg q. 8 hours as needed.  5. Amlodipine 5 mg daily.  6. Aricept 10 mg at bedtime.  7. Aspirin 81 mg daily.  8. Atropine ophthalmic solution two drops sublingually q. four hours as needed for excessive secretions.  9. Robitussin 5 mL b.i.d.  10. Fentanyl patch 25 mcg every 72 hours.  11. Neurontin 300 mg b.i.d.  12. Glipizide 2.5 mg daily. 13. Lidoderm patch to be applied to the left knee daily.  14. Lisinopril 5 mg daily. 15. Singulair 10 mg daily.  16. Omeprazole 20 mg daily.  17. MiraLAX 17 grams daily.  18. Robitussin as needed.  19. Zoloft 125 mg daily.  20. Vitamin D3 50,000 international units monthly.   PHYSICAL EXAMINATION:  VITAL SIGNS: Temperature 98, pulse 52, respirations 22, blood pressure 123/68, sats 97% on 2 liters nasal.  GENERAL: She is a pleasant but nonverbal female, lethargic, lying in bed, but in no apparent distress.   HEENT: Atraumatic, normocephalic. Her extraocular muscles are intact. Her pupils are equal and reactive to light. Sclerae anicteric. No conjunctival injection. No oropharyngeal erythema.   NECK: Supple. No jugular venous distention. No bruits, no lymphadenopathy, no thyromegaly.   HEART: Regular rate and rhythm, distant. No murmurs, no rubs, and no clicks.   LUNGS: Difficult to do a good lung exam. She does not take good deep breaths but no rales, no rhonchi, no wheezes appreciated. Negative use of accessory muscles. No dullness to percussion.   ABDOMEN: Soft,  flat, nontender, nondistended. Has good bowel sounds. No hepatosplenomegaly appreciated.   EXTREMITIES: She does have +1 to 2 pitting edema from the knees to the ankles bilaterally. She also has +1 to 2 pitting edema on the left upper extremity as compared to the right upper extremity.  She has +2 pedal and radial pulses bilaterally.   SKIN: Moist and warm with no  rashes appreciated.   LYMPHATIC: There is no cervical or axillary lymphadenopathy.   NEUROLOGIC: She is globally weak, difficult to do a full neurological exam. Follows some simple commands. No other focal motor or sensory deficits appreciated.   LABORATORY, DIAGNOSTIC, AND RADIOLOGICAL DATA:  Serum glucose 124, BUN 30, creatinine 1.6, sodium 140, potassium 6.5, chloride 108, bicarbonate 27. Magnesium is elevated at 3. LFTs mildly elevated, alkaline phosphatase 144, AST 133, ALT 113, troponin mildly elevated at 0.07. White cell count 8, hemoglobin 8.3, hematocrit 26.9, platelet count 169.   ASSESSMENT AND PLAN: This is an 81 year old female with history of diastolic congestive heart failure which is chronic in nature, morbid obesity, history of chronic kidney disease, stage III-IV, diabetes, hypertension, chronic pain syndrome, gastroesophageal reflux disease, dementia, and depression who presents to the hospital from a skilled nursing facility due to shortness of breath and cough and was noted to be in mild congestive heart failure and also hyperkalemic.  1. Acute hyperkalemia: The exact etiology of this is currently unclear. The patient apparently is not taking potassium supplements. She does have chronic kidney disease, but her renal function is not any worse than her function at the time of discharge about a week or so ago. Questionable if this is related to the use of ACE inhibitors as she is on a low-dose lisinopril, which I will hold for now. She has received some Kayexalate here in the Emergency Room. I will repeat her potassium later today. Her EKG did show some bradycardia but no evidence of any acute changes. We will consult nephrology if needed if she has refractory hyperkalemia.  2. Acute congestive heart failure: This is likely acute on chronic diastolic dysfunction. The patient had an echocardiogram done in April which showed normal ejection fraction. She does have some mild peripheral edema  with some upper extremity edema, questionable if this is chronic as she has hypoalbuminemia although chest x-ray findings are suggestive of mild interstitial edema and she has a slightly elevated BNP. I will go ahead and check diurese her gently with IV Lasix, follow ins and outs and daily weights. I will hold her ACE inhibitor for now given the hyperkalemia.  3. Elevated troponin: I think this is likely in the setting of hypoxemia and congestive heart failure. No evidence of acute coronary syndrome. I will go ahead and place her on telemetry, follow cardiac markers. There is no evidence of acute myocardial infarction presently.  4. Chronic kidney disease, stage III/IV:  Her creatinine is currently at baseline. We will renal dose her medications and avoid nephrotoxins for now.  5. Bradycardia: The patient was bradycardic during her last hospitalization and had to be put on some intermittent dopamine drip. I do not think she needs that at this time as she is presently hemodynamically stable, although I will place her on telemetry at this point and follow hemodynamics and her heart rate.    6. Diabetes: Continue with glipizide.  7. Chronic obstructive pulmonary disease: No evidence of acute exacerbation. Continue Advair, Singulair, and p.r.n. DuoNebs.  8. Dementia: Continue Aricept.  9. Depression. Continue Zoloft.  10.  Chronic pain syndrome: The patient is fairly lethargic. I will hold all her pain medications including her fentanyl patch and her oxycodone for now.   The patient's overall prognosis seems very poor given her multiple comorbidities. Last time palliative care consult was obtained to discuss goals of care and family wanted her to be a FULL CODE.  I will have them readdress this during this hospitalization. The patient presently remains a FULL CODE.   TIME SPENT: 55 minutes.  ____________________________ Rolly PancakeVivek J. Cherlynn KaiserSainani, MD vjs:bjt D: 10/29/2011 14:22:56 ET T: 10/29/2011 15:30:03  ET JOB#: [id number]  cc: Rolly PancakeVivek J. Cherlynn KaiserSainani, MD, <Dictator> Teena Iraniavid M. Terance HartBronstein, MD [place] SirenVIVEK J Tiyona Desouza MD ELECTRONICALLY SIGNED 10/30/2011 20:25

## 2014-06-02 NOTE — Consult Note (Signed)
CC: anemia.  I will sign off, reconsult if needed  Electronic Signatures: Scot JunElliott, Fenris Cauble T (MD)  (Signed on 10-Sep-13 10:23)  Authored  Last Updated: 10-Sep-13 10:23 by Scot JunElliott, Tobie Perdue T (MD)

## 2014-06-02 NOTE — H&P (Signed)
PATIENT NAME:  Jacqueline Browning, Jacqueline Browning MR#:  [id number] DATE OF BIRTH:  January 06, 1922  DATE OF ADMISSION:  10/18/2011  PRIMARY CARE PHYSICIAN: Dr. Dorothey Basemanavid Bronstein   Case discussed with ER physician, Dr. Glenetta HewMcLaurin, history obtained from patient, patient's daughter. Old records have been reviewed. Patient's imaging studies and EKG have been personally reviewed.   CHIEF COMPLAINT: Shortness of breath and abnormal blood work.   HISTORY OF PRESENT ILLNESS: 81 year old morbidly obese African American female patient with history of congestive heart failure, hypertension, coronary artery disease and asthma presents to the Emergency Room sent in after she was found to have abnormal blood work with acute renal failure. Patient also mentions that she had some mild shortness of breath. Patient is a poor historian. Does not complain of any chest pain, nausea, vomiting, abdominal pain. All she is concerned about not right now is that she needs food. Has not had any dysuria. Resident of a skilled nursing facility. Most of the history obtained from old records and her daughter.   Patient was found to have elevated potassium of 7 with acute renal failure with creatinine of 2.1, and has been bradycardic into the 30s, had received two doses of atropine in the Emergency Room, continues to be bradycardic in the 30s and 40s. Patient was just given a dose of calcium, insulin, dextrose and bicarbonate. Patient was initially on 15 liters of oxygen with EMS and presently is on 4 liters of oxygen for her acute respiratory failure.   PAST MEDICAL HISTORY:  1. Congestive heart failure, unknown ejection fraction. 2. Sinus bradycardia. 3. Hypertension. 4. Diabetes mellitus. 5. Dementia. 6. Coronary artery disease. 7. Chronic kidney disease.  8. Gastroesophageal reflux disease. 9. Diverticulosis with rectal bleeding. 10. Chronic anemia. 11. Mild dysphagia.   ALLERGIES: Celebrex, Zantac.   FAMILY HISTORY: Reviewed but unknown at this  time.   SOCIAL HISTORY: Patient is a resident of skilled nursing facility. Was a past smoker, presently no smoking, alcohol. Her health care power of attorney is Rachelle HoraMeleasha Holt with phone # [phone].   REVIEW OF SYSTEMS: Unobtainable as patient has some mild encephalopathy and is an unreliable historian.   HOME MEDICATIONS:  1. Acetaminophen oxycodone 325/5, 1 tablet orally every six hours as needed for pain.  2. Advair Diskus 250/50, 1 puff inhaled two times a day.  3. Albuterol ipratropium 1 puff inhaled every six hours as needed for shortness of breath.  4. Albuterol ipratropium 2.5/0.5, 3 mL inhaled every six hours.  5. Alprazolam 0.25 mg 1 tablet orally every eight hours as needed for anxiety.  6. Amlodipine 10 mg orally once a day.  7. Aricept 10 mg orally once a day.  8. Aspirin 81 mg orally once a day.  9. Atropine ophthalmic solution two drops sublingual every four hours as needed for excessive secretions.  10. Cheratussin A.Browning. 10 mg 5 mL orally 2 times a day.  11. Fentanyl 25 mcg every three days.  12. Gabapentin 300 mg orally 2 times a day.  13. GenTeal ophthalmic gel one drop each affected eye 3 times a day.  14. Glipizide 2.5 mg 1 tablet orally once a day.  15. Lidoderm 5% topical patch apply topically to affected area once a day.  16. Lisinopril 5 mg orally once a day.  17. Montelukast 10 mg orally once a day.  18. Omeprazole 20 mg orally once a day.  19. MiraLax 17 grams orally once a day.  20. Sertraline 100 mg orally once a day.  21.  Vitamin D3, 50,000 international units orally once a day.     PHYSICAL EXAMINATION:  VITAL SIGNS: Temperature 98.3, pulse ranging between 30 to 55, blood pressure 167/58, saturating 98% on 4 liters oxygen.   GENERAL: Morbidly obese African American female patient lying in bed mildly confused, drowsy.   PSYCHIATRIC: Drowsy, is oriented to person but not place and time. Has a flat affect.   HEENT: Atraumatic, normocephalic. Oral  mucosa dry and pink. External ears and nose normal. Pallor positive. No icterus. Pupils bilaterally equal and reactive to light.   NECK: Supple. No thyromegaly. No palpable lymph nodes. Trachea midline. No carotid bruit or JVD.   CARDIOVASCULAR: S1, S2 bradycardia with systolic murmur. Has 1+ lower extremity edema, decreased peripheral pulses in the lower extremities.   RESPIRATORY: Bilateral basal crackles, use of accessory muscles.   GASTROINTESTINAL: Soft abdomen, nontender. Bowel sounds present. No hepatosplenomegaly palpable secondary to obesity.   GENITOURINARY: No CVA tenderness, bladder distention.   MUSCULOSKELETAL: No joint swelling, redness, effusion of the large joints. Normal muscle tone.   SKIN: Warm and dry. No petechiae, rash, ulcers.   NEUROLOGICAL: Generalized decreased motor strength 4/5. Cranial nerves II through XII intact.   LABORATORY, DIAGNOSTIC AND RADIOLOGICAL DATA: Laboratory studies show glucose 123, BUN 67, creatinine 2.81, sodium 136, potassium 7, bicarbonate 25, GFR 14, WBC 7.1, hemoglobin 8. Urinalysis done yesterday shows 3+ bacteria and less than 1 WBC.   EKG shows flattened P waves with bradycardia of 44 and flattened T waves. With possibly hidden F waves.   Chest x-ray shows bilateral basilar atelectasis with small bilateral pleural effusions. No clear infiltrate. Noticed mild congestive heart failure. Personally reviewed and compared to prior chest x-ray.   ASSESSMENT AND PLAN:  1. Severe hyperkalemia with EKG changes in a patient with acute renal failure on ACE inhibitors. Patient will be admitted to the Critical Care Unit with close monitoring on telemetry. Patient has received a dose of calcium, insulin and glucose along with bicarbonate at this time. Will start her on scheduled albuterol nebulizer every four hours, give her a dose of Kayexalate. Will repeat calcium dose along with bicarbonate. Patient is at high risk for deterioration. I have  discussed the case with her DPOA, Rachelle Hora, her granddaughter who has mentioned that the patient is FULL CODE. If needed patient will be started on dopamine or Isuprel depending on her blood pressure to keep her heart rate up. Will also have atropine ready by the bedside for the patient. Will also get three sets of cardiac enzymes. Consult cardiology for further input and consult nephrology.  2. Acute renal failure. This is likely secondary to congestive heart failure. This is causing hyperkalemia. Will start patient on Lasix. Consult nephrology. Patient may need dialysis if the hyperkalemia is resistant.  3. Acute on chronic congestive heart failure. Start patient on Lasix. Will also get a 2-D echocardiogram to look for systolic or diastolic dysfunction.  4. Sinus bradycardia secondary to hyperkalemia. Please see above.  5. Normocytic anemia. Patient's baseline hemoglobin seems to be around 10 but presently is at 8. She does not have any melena or frank bleeding. Will check iron studies and stool for Hemoccult.  6. Acute encephalopathy secondary to acute renal failure and severe hyperkalemia. Needs to be followed.  7. Deep venous thrombosis prophylaxis with heparin.  8. CODE STATUS: FULL CODE.  9. The critical nature of the case and acute illness was discussed with family, her granddaughter DPOA, who have verbalized understanding.  TIME SPENT: Time spent today on this case in coordination of care was 80 minutes.  ____________________________ Molinda Bailiff Jacqueline Blasko, MD srs:cms D: 10/18/2011 21:07:17 ET T: 10/19/2011 07:03:06 ET  JOB#: [id number] cc: Wardell Heath R. Synda Bagent, MD, <Dictator> Teena Irani. Terance Hart, MD Orie Fisherman MD ELECTRONICALLY SIGNED 10/19/2011 13:48

## 2014-06-07 NOTE — Discharge Summary (Signed)
PATIENT NAME:  Jacqueline Browning, Jacqueline Browning MR#:  [id number] DATE OF BIRTH:  [birth year]  DATE OF ADMISSION:  05/26/2011 DATE OF DISCHARGE:  05/30/2011  CHIEF COMPLAINT: Shortness of breath, hypoxia, and respiratory failure.   DISCHARGE DIAGNOSES:  1. Metabolic encephalopathy from acute respiratory failure, renal failure, and urinary tract infection. 2. Asthma exacerbation/bronchitis.  3. Acute renal failure.  4. Extended-spectrum beta-lactamase Escherichia coli urinary tract infection.  5. Transaminitis, improving. 6. History of congestive heart failure.  7. Hypertension.  8. Diabetes.  9. Elevated troponin likely from demand ischemia from respiratory and renal failure.  10. History of dementia.  11. History of coronary artery disease.  12. History of recurrent urinary tract infections and sepsis.  13. History of hypoglycemia. 14. History of aspiration pneumonia.  15. History of renal failure.  16. History of sinus bradycardia.  17. Gastroesophageal reflux disease.  18. Chronic anemia. 19. History of dysphagia.   DISCHARGE MEDICATIONS:  1. Bleph-10 ophthalmic solution two drops to each eye three times daily. 2. Robitussin-DM 10 mL every 6 hours as needed for cough.  3. Alprazolam 0.25 mg three times daily as need for anxiety.  4. Omeprazole 20 mg daily.  5. Ted hose. 6. Aspirin 81 mg daily.  7. Atropine Care 1% ophthalmic two drops sublingual two times a day and two drops every 4 hours as needed.  8. Fentanyl patch 25 mcg/h every 72 hours. 9. Fluticasone 50 mcg inhaled nasally once a day.  10. Lisinopril 5 mg daily.  11. Polyethylene glycol 17 grams daily as needed for constipation.  12. Montelukast 10 mg once a day.  13. Gabapentin 300 mg twice a day. 14. Ketotifen 0.025% ophthalmic solution one drop in both eyes twice a day.  15. Nebulizers, one vial in nebulizer four times daily as needed for shortness of breath and inhaler 2 puffs inhaled four times daily.  16. Donepezil 10 mg once a  day.  17. Sliding scale insulin.  18. Amlodipine 10 mg daily.  19. Prednisone 40 mg daily for three days, then stop.  20. Ertapenem 1 gram every 24 hours for one more day.  21. Zoloft 125 mg daily.   DIET: ADA, diabetic, pureed diet with thin liquids and aspiration precautions.   DISCHARGE FOLLOWUP: Please follow-up with her primary care physician within 1 to 2 weeks and repeat LFTs and a BMP.   DISPOSITION: To SNF.   CODE STATUS: FULL CODE.  HISTORY OF PRESENT ILLNESS: For full details, please see the history and physical dictated on 05/26/2011 by Dr. Cherylann Ratel. But briefly this is an 81 year old elderly African American female with multiple comorbidities who was transferred from [facility] after the patient had shortness of breath and hypoxia with oxygen saturations of 80% on room air. ABG revealed acidosis and hypercarbic respiratory failure and she was placed on BiPAP and transferred to the critical care unit. The patient had acute renal failure, elevated troponin, and transaminitis as well.   SIGNIFICANT LABS/IMAGING: Initial creatinine was 1.8, BUN 43, and glucose 106. BNP is 6429. Creatinine by discharge normalized to 1.07 and BUN 30. Initial potassium 5.6. Initial alkaline phosphatase 213, bilirubin 0.5, albumin 3.2, AST 1353, and ALT 461. Last AST was 336 on 05/28/2011 and ALT of 287 with alkaline phosphatase of 144. Initial troponin 0.22 and peak troponin 0.27. Last troponin 0.15. Normal CK and MBs. TSH 0.712. Valproic acid 36. Initial WBC 7.3, hemoglobin 10.9, hematocrit 34.6, and platelets 207. INR 1.3.   Urine cultures showing ESBL Escherichia coli. The patient  is currently on Invanz.   Vitamin B12 in serum is elevated at 1599. Serum acetaminophen level is negative. Hepatitis panel showed negative hepatitis C. Hepatitis A antibody total is positive, but IgM is negative, and hepatitis B core antibody is positive and core IgM antibody is negative. Hepatitis B surface antibody is 0.9  and consistent with immunity.   Initial ABG: pH 7.3, pCO2 74, and pO2 85 on BiPAP.  Last ABG on the same day, 05/26/2011, at night: pH 7.35, pCO2 68, and pO2 339, on the BiPAP.   Echocardiogram on 05/27/2011 showed normal ejection fraction, mild diastolic dysfunction, and moderate mitral regurgitation.   CT of the head without contrast: Chronic white matter ischemic disease, mild chronic sinusitis.   X-ray of the chest, one view: Mild prominence of the pulmonary vascularity compatible with chronic pulmonary vascular congestion. No pulmonary edema, pleural effusions, or pneumonia.   Ultrasound of the abdomen: Mild prominence of the common bile duct. No acute intraabdominal abnormality. Small pleural effusions.   HOSPITAL COURSE: The patient was initially admitted to the Critical Care Unit on BiPAP. She was started on nebulizers and ceftriaxone antibiotics. She was treated for presumed bronchitis. X-ray of the chest was not suggestive of a pneumonia. The patient was also started on IV fluids for the renal failure. The patient was transitioned onto to nasal cannula. Currently her shortness of breath is much improved. She is not wheezing. She denies shortness of breath. She has been transitioned to room air. She is currently on azithromycin, which we will stop, for the bronchitis, as she is also on aztreonam. She has had four days of antibiotics for the bronchitis. She had a cough which has improved. She had some blood tinged cough yesterday which also is tapering off. She is to continue bronchodilators via nebulizer and her Advair. She is to continue her montelukast as well.   In regards to renal failure, this was likely secondary to dehydration and possibly in the setting of medications and diuretics. Her ACE inhibitor and diuretics were held. Currently she is not in a volume overload state. She was started on IV fluids and her renal function has dramatically improved. Her ACE inhibitor has been  started today and it could be continued as an outpatient. She has been on several different diuretics for diastolic dysfunction. Namely, she was on Demadex and Lasix prior. Those have been held here and she has no evidence of acute congestive heart failure or volume overload. Upon discharge perhaps low-dose Lasix or Demadex could be started. She did have elevated troponins, however, that was suspected to be likely from demand ischemia from respiratory and renal dysfunction. She had no chest pain. They have been trending down. Echo was ordered which showed a normal ejection fraction and mild diastolic dysfunction. She is on an aspirin. A statin could not be given given the significant transaminitis. Furthermore no beta blockers have been restarted here. She was on metoprolol as an outpatient; however, given some bradycardia she had here with history of sinus bradycardia and the bronchospasm in the setting of asthma exacerbation, that has been held.   In regards to abnormal LFTs, they have been trending down. Likely this is possibly from medication induced versus shock liver. Serum acetaminophen level was within normal limits. Viral hepatitis screen is as above and it could perhaps be repeated as an outpatient. We held acetaminophen and Percocet as well as a statin. Currently, they are trending down and the patient has no significant abdominal pain. Full LFTs  should be repeated in about a week.   In regards to her altered mental status, she was incoherent on arrival, per her family members. This is likely secondary to metabolic encephalopathy from dehydration, renal failure, and urinary tract infection, and has reversed. The patient does have baseline dementia.  In regards to hyperkalemia, she did receive IV fluids and a dose of Kayexalate and the patient was given calcium gluconate given the bradycardia. The hyperkalemia has resolved. We have continued to hold beta blocker. TSH is within normal limits.   In  regards to the urinary tract infection, urine cultures did grow ESBL Escherichia coli and she was started on IV Invanz. She currently is on isolation. She is to be treated for three days. She had minimum symptoms. She is on day two currently.   In regards to her high blood pressure, she was started on amlodipine and the rate has been increased as we initially held the ACE inhibitor and beta blocker. Blood pressure is acceptable. Currently she is on amlodipine and ACE inhibitor.   In regards to her diabetes, her metformin will be held and she is to continue her sliding scale insulin.   In regards to chronic dysphasia, she was seen by speech therapy and is on a pureed diet. She has baseline dementia and is on Aricept.   At this time her respiratory and renal function has improved as well as her mentation. She will be discharged back to her SNF.   CODE STATUS: THE PATIENT REMAINS FULL CODE.  TOTAL TIME SPENT: 35 minutes.  ____________________________ Krystal EatonShayiq Kaylia Winborne, MD sa:slb D: 05/30/2011 12:48:29 ET T: 05/30/2011 13:22:30 ET JOB#: [id number]  cc: Krystal EatonShayiq Cutler Sunday, MD, <Dictator> Teena Iraniavid M. Terance HartBronstein, MD Krystal EatonSHAYIQ Bonni Neuser MD ELECTRONICALLY SIGNED 06/02/2011 12:44

## 2014-06-07 NOTE — H&P (Signed)
PATIENT NAME:  Jacqueline Browning, Jacqueline Browning MR#:  [id number] DATE OF BIRTH:  1921-11-06  DATE OF ADMISSION:  05/26/2011  REFERRING PHYSICIAN: Dr. Jens SomWiegand PRIMARY CARE PHYSICIAN: Dr. Terance HartBronstein at [facility]  REASON FOR ADMISSION: Shortness of breath, hypoxia, acute respiratory failure.   HISTORY OF PRESENT ILLNESS: Patient is an elderly 81 year old female with extensive past medical history as listed below including hypertension, diabetes mellitus, dementia, coronary artery disease, congestive heart failure, history of renal failure, chronic dysphagia, chronic anemia, recurrent urinary tract infections, history of atrial fibrillation, gastroesophageal reflux disease who is sent from [facility] skilled nursing facility due to shortness of breath and was noted to be hypoxic with oxygen sats in the 80% range on room air. Her daughter was called, Jacqueline Browning, who has medical power of attorney, phone number [phone], and saw the patient and noted her to be incoherent and requested that her mother be sent to the hospital for further evaluation. Patient arrives to the ER in a critically ill condition with multiple lab abnormalities. She appears very dry and volume deplete on exam. Her sats are 80% on room air. An ABG revealed that she is acidotic and hypercarbic and has since been placed on BiPAP. She is also hyperkalemic and has evidence of acute renal failure in addition to abnormal liver function tests and significant transaminitis. She also has a minimally elevated troponin. She was noted to be bradycardic as well. She is moving all of her extremities spontaneously and partially follows commands but has incoherent speech and therefore is unable to provide any history or review of systems at this time. Much of the history was obtained from speaking with the ER physician and speaking with the patient's daughter over the phone. Chest x-ray was obtained revealing chronic congestion but no pulmonary edema or  infiltrates were noted. She was noted to have an elevated BNP. Given elevated BNP as well as elevated potassium level ER physician gave the patient a dose of Lasix to help with her hyperkalemia. Otherwise, patient has not been able to provide any history at this time and given all these abnormalities and patient's currently critically ill condition hospitalist services were contacted for further evaluation and for hospital admission.   PAST SURGICAL HISTORY: Colonoscopy January 2012.   PAST MEDICAL HISTORY: 1. Hypertension.  2. Diabetes mellitus.  3. Dementia.  4. History of congestive heart failure, unknown ejection fraction.  5. History of coronary artery disease.  6. History of recurrent UTIs with history of sepsis secondary to urinary tract infection with Citrobacter noted in the urine at that time and she was hypothermic.  7. History of pseudomonal urinary tract infection.  8. History of altered mental status likely encephalopathy secondary to hypoglycemia.  9. History of hypoglycemia.  10. History of aspiration pneumonia.  11. History of renal failure.  12. History of sinus bradycardia.  13. History of gastroesophageal reflux disease. 14. History of constipation.   15. History of hospital admission 02/2010 for rectal bleeding and she underwent colonoscopy and was noted to have diverticulosis but no active bleeding noted. 16. History of chronic anemia.  17. History of dysphagia.   ALLERGIES: Celebrex and Zantac.   MEDICATIONS: From review of MAR sent from skilled nursing facility multiple medications including the following:   1. Acetaminophen 500 mg p.o. q.4 hours p.r.n.  2. Oxycodone acetaminophen 5/325, 2 tabs p.o. t.i.d.  3. Aluminum hydroxide/magnesium hydroxide/simethicone 20 mL p.o. b.i.d.  4. Albuterol ipratropium inhaler 2 puffs inhaled q.i.d.  5. DuoNebs q.i.d. p.r.n.  6. Alprazolam 0.25 mg p.o. b.i.d. p.r.n.  7. Vitamin Browning 500 mg p.o. b.i.d.  8. Aspirin 81 mg daily.   9. Atropine Care 1% ophthalmic solution two drops b.i.d. and two drops q.4 hours p.r.n.   10. Bleph-10 ophthalmic solution two drops to each affected eye t.i.d. starting 05/23/2011, ending 05/31/2011 for conjunctivitis. 11. Chloraseptic sore throat spray 5 sprays mucous membranes q.2 hours p.r.n.  12. Demadex 20 mg p.o. daily. 13. Depakote 250 mg p.o. b.i.d.  14. Senna docusate 1 tab p.o. b.i.d.  15. Donepezil 10 mg p.o. daily.  16. Fentanyl 25 mcg/h patch one patch transdermally applied q.72h.  17. Iron sulfate 325 mg p.o. b.i.d.  18. Fluticasone 50 mcg inhaled one spray to each nostril daily.  19. Advair Diskus 250/50, 1 dose inhaled b.i.d.  20. Gabapentin 300 mg p.o. b.i.d.  21. Ketotifen 0.025% ophthalmic solution one drop to both eyes b.i.d.  22. Lisinopril 5 mg daily.  23. Magnesium oxide 400 mg p.o. b.i.d.  24. Metformin extended release 750 mg p.o. daily.  25. Metoprolol succinate 25 mg p.o. daily.  26. Mineral oil 1 tbsp (15 mL) p.o. daily.   27. Singulair 10 mg daily.  28. Multivitamin 1 tablet daily.  29. Nitrostat p.r.n.  30. Novolin R sliding scale insulin.  31. Omeprazole 20 mg p.o. daily.  32. MiraLax 17 grams p.o. daily.  33. ProAir HFA 90 mcg inhaled 2 puffs inhaled q.4 hours p.r.n.  34. ProStat sugar-free lipid 30 mL p.o. daily. 35. Robitussin-DM 10 mL p.o. q.i.d. p.r.n.  36. Sertraline 125 mg p.o. daily.  37. Simvastatin 10 mg p.o. daily.  FAMILY HISTORY: Unobtainable from patient.   SOCIAL HISTORY: Unobtainable from patient. Per previous history and physical in our system she was smoking about 1/2 pack per day in the past. No mention of alcohol or drug use. Patient is currently resident of [facility] skilled nursing facility. Her healthcare power of attorney her daughter, Jacqueline Browning, phone number [phone].   REVIEW OF SYSTEMS: Unobtainable from the patient given her current condition and altered mental status.   PHYSICAL EXAMINATION:  VITAL SIGNS:  Temperature 97.6, pulse 51, blood pressure 186/95, respirations 18, oxygen sat 80% on room air, 96% to 98% on BiPAP.  GENERAL: Patient is awake and she is alert, disoriented. She partially follows commands. She has incoherent speech. She is in moderate respiratory distress.  HEENT: Normocephalic, atraumatic. Pupils are equal, round, and reactive to light and accommodation. She has got injected sclerae bilaterally with mild purulent drainage noted in both conjunctivae bilaterally. Difficult to assess her extraocular muscle function as she only partially follows commands. She has got anicteric sclerae. Conjunctivae pink. Hearing appears to be intact to voice. Nares without drainage. Oral mucosa is extremely dry but difficult to inspect her oropharynx fully as BiPAP is applied.   NECK: Supple. No JVD, lymphadenopathy or carotid bruits bilaterally but this is somewhat difficult to auscultate since BiPAP is currently present No thyromegaly or tenderness to palpation over thyroid gland.   CHEST: Increased respiratory effort. Patient is tachypneic. She has got some abdominal breathing. She has got decreased breath sounds bilaterally with diffuse wheezing throughout which is mild, however, and no crackles, rales, or rhonchi.   CARDIOVASCULAR: Distant S1, S2, bradycardic. No murmurs, rubs, or gallops. PMI is non- lateralized.   ABDOMEN: Soft, nontender, nondistended. Normoactive bowel sounds. No hepatosplenomegaly or palpable mass. No hernia.  EXTREMITIES: 1 to 2+ pitting edema bilateral lower extremities without clubbing, cyanosis, erythema, or warmth. Pedal  pulses are palpable bilaterally.   SKIN: No suspicious rashes. Skin turgor is poor.   LYMPH: No cervical lymphadenopathy.   NEUROLOGICAL: Patient is alert but disoriented to place, time, year and date. She is oriented to himself. She is moving all of her extremities spontaneously. Difficult to perform full neurological exam as she has altered mental  status. Sensation appears to be intact.   PSYCH: Cannot assess her affect currently.   LABORATORY, DIAGNOSTIC AND RADIOLOGICAL DATA:  BNP 6429. CK 154, CK-MB 1, troponin 0.22.   CBC: WBC 7.3, hemoglobin 10.9, hematocrit 34.6, platelets 207, MCV 104.   Complete metabolic panel: Sodium 139, potassium 5.6, chloride 99, bicarbonate 33, BUN 43, creatinine 1.80, glucose 106, calcium 7.9, anion gap 7, total protein 8.1, albumin 3.2, total bilirubin 0.5, AST 1353, ALT 461, alkaline phosphatase 213, anion gap 7.   Urinalysis: Cloudy urine with 215 RBCs, 3+ blood, 4 WBCs, 3+ bacteria, nitrite negative, trace leukocyte esterase.  INR 1.3.  ABG: pH 7.31, pCO2 74, pO2 85, bicarbonate 37.3 and this was on BiPAP.    Portable chest x-ray: Mild prominence of the pulmonary vascularity compatible with chronic pulmonary vascular congestion but no pulmonary edema, pleural effusion or pneumonia is identified.   EKG: Sinus bradycardia, heart rate 54 beats per minute, normal axis, normal interval, nonspecific ST and T wave changes.   ASSESSMENT AND PLAN: An 81 year old female with multiple medical problems including, hypertension, diabetes mellitus, asthma, coronary artery disease, congestive heart failure with unknown ejection fraction, dementia, gastroesophageal reflux disease, renal failure, chronic dysphagia, chronically recurring urinary tract infection, history of atrial fibrillation, history of sepsis, history of rectal bleeding sent in for shortness of breath and incoherence, noted have sats in the 80s, also now noted to have multiple lab abnormalities with:  1. Acute hypoxic/hypercarbic respiratory failure-Suspect this is due to acute asthma exacerbation and acute bronchitis. There is no pneumonia or pulmonary edema noted on chest x-ray. Will keep patient on BiPAP and obtain a follow-up an ABG level and maintain her oxygen sats greater than 90%. For asthma exacerbation will start her on IV steroids and IV  antibiotics and provide bronchodilator support and also give nebulizers and MDIs and continue Advair therapy. Further work-up and management to follow depending on patient's clinical course and primary team to consider pulmonology consultation.  2. Acute asthma exacerbation with acute bronchitis-As above. 3. Acute renal failure with dehydration-Patient appears volume deplete on exam with dry mucous membranes and poor skin turgor. Will hold her diuretic therapy for now and avoid further nephrotoxins. Will also hold her ACE inhibitor given acute renal failure. Hold metformin as well. Monitor ins and outs strictly. Will follow patient's renal function closely with hydration. If there is no improvement of patient's renal function posthydration would recommend nephrology evaluation and further evaluation such as renal ultrasound.  4. Elevated troponin-Suspect demand ischemia from respiratory failure and from poor renal clearance/acute renal failure rather than acute coronary syndrome given lack of chest pain and normal CK and CK-MB levels. Will manage her respiratory and renal failure as above and provide supportive care and continue to cycle her cardiac enzymes. Obtain 2-D echocardiogram. Maintained her on aspirin therapy. Primary team to consider cardiology evaluation but will hold off for now unless her troponin significantly rises or unless there is a strong concern for myocardial infarction or for primary cardiac event.  5. Abnormal liver function tests-Not sure if this is medication induced. It could also be shock liver versus ischemic hepatopathy. Will check serum acetaminophen  level. Obtain abdominal ultrasound and viral hepatitis screen. Will provide supportive care for now and hydrate patient with IV fluids. Avoid hepatotoxins. Hold her acetaminophen and also hold statin therapy for now. Will reassess her hepatic function in the morning and if no improvement consider GI consultation. Per her daughter there  has been no mention of abdominal pain, nausea or vomiting and this currently appears to be asymptomatic.  6. Altered mental status-Patient has been incoherent per daughter. This likely represents metabolic encephalopathy from dehydration, acute renal failure and respiratory failure in addition to her acute illnesses including respiratory failure. Will obtain a noncontrast head CT. Otherwise, continue supportive care for now. Obtain frequent neuro checks and monitor her mental status closely and if there is no improvement with the above-mentioned measures primary team to consider neurology consultation. 7. Hyperkalemia-ER physician gave a dose of Lasix which should help with shifting of her potassium. However, this may lead to worsening of her renal function as she is already dehydrated. For now will give a dose of Kayexalate and also hold ACE inhibitor therapy for now and hydrate her with IV fluids to see if this helps as well. Since patient is bradycardic will also give a dose of calcium gluconate. Will obtain a repeat serum potassium level. 8. Bradycardia-This could be medication induced and she also has a history of it in the past. Hold metoprolol for now to see if her heart rate improves and monitor her on telemetry continuously. Will check TSH as well. If heart rate remains persistently low would recommend cardiology consultation. If her heart rate continues to drop she may also require chronotropic support such as dopamine.  9. History of coronary artery disease-Continue aspirin for now. Hold beta blocker given bradycardia. Hold ACE inhibitor given acute renal failure. Continue to cycle her cardiac enzymes and obtain echocardiogram and primary team to consider cardiology evaluation. 10. History of congestive heart failure-Unknown ejection fraction. Not sure if this is systolic or diastolic. She has never had an echocardiogram in our system and will assess further by obtaining 2-D echocardiogram. Hold  Lasix due to dehydration and acute renal failure. Hold ACE inhibitor due to acute renal failure and hyperkalemia. Hold beta blockers since she is bradycardia. Will gently and cautiously hydrate her. Monitor ins and outs closely. Primary team to consider cardiology evaluation. 11. Possible mild urinary tract infection-Based off abnormal urinalysis. Will have her Foley catheter changed. Obtain a urine culture and start empiric IV antibiotics and monitor clinical response.  12. Anemia of chronic disease-Currently appears to be stable. Will keep her on iron supplementation and follow her hemoglobin and hematocrit closely.  13. Hypertension-Hold beta blocker given bradycardia. Hold Lasix and ACE inhibitor for reasons mentioned above. Will keep her on p.r.n. IV hydralazine and monitor blood pressure closely.  14. Type 2 diabetes mellitus-Hold metformin given renal impairment. Keep patient on sliding scale insulin for now and check hemoglobin A1c. 15. Hyperlipidemia-Hold statin given abnormal liver function tests. Check fasting lipid profile in a.m. 16. Chronic dysphagia-Will keep her on dysphagia diet for now and provide assistance with meals and get speech therapy evaluation.  17. Dementia-Continue Aricept.  18. Deep vein thrombosis prophylaxis-Subcutaneous heparin as long as head CT is negative for bleed.  19. CODE STATUS: Currently FULL CODE. A MOST form has been sent from her skilled nursing facility confirming that patient is FULL CODE and wants full scope of treatment. She is currently critically ill and at high risk for cardiopulmonary arrest and death and recommend palliative  care consultation to help define goals of care but will defer to primary team as there is no palliative care team present here over the weekend.  20. Plan of care with further work-up and management were discussed in great detail with the patient's daughter/healthcare power of attorney, Jacqueline Browning, who is currently in agreement  with all of the above and with hospital admission.   TOTAL CRITICAL CARE TIME: 80 minutes and total time spent on this admission approximately 90 minutes and probably more.   ____________________________ Elon Alas, MD knl:cms D: 05/26/2011 21:13:30 ET T: 05/27/2011 07:40:25 ET JOB#: [id number]  cc: Elon Alas, MD, <Dictator> Teena Irani. Terance Hart, MD  Elon Alas MD ELECTRONICALLY SIGNED 06/19/2011 15:08
